# Patient Record
Sex: Male | Born: 1940 | Hispanic: No | Marital: Married | Smoking: Never smoker
Health system: Southern US, Community
[De-identification: ages and names within clinical notes are randomized; demographics above are authoritative.]

## PROBLEM LIST (undated history)

## (undated) ENCOUNTER — Ambulatory Visit (HOSPITAL_COMMUNITY): Admission: EM | Discharge status: Absent without leave | Payer: Medicare Other

## (undated) ENCOUNTER — Emergency Department (HOSPITAL_COMMUNITY): Discharge status: Absent without leave | Payer: Medicare Other

## (undated) DIAGNOSIS — J45909 Unspecified asthma, uncomplicated: Secondary | ICD-10-CM

## (undated) HISTORY — PX: ABDOMINAL SURGERY: SHX537

---

## 2001-03-20 ENCOUNTER — Emergency Department (HOSPITAL_COMMUNITY): Admission: EM | Admit: 2001-03-20 | Discharge: 2001-03-21 | Payer: Self-pay | Admitting: Emergency Medicine

## 2001-03-21 ENCOUNTER — Encounter: Payer: Self-pay | Admitting: Emergency Medicine

## 2001-03-22 ENCOUNTER — Emergency Department (HOSPITAL_COMMUNITY): Admission: EM | Admit: 2001-03-22 | Discharge: 2001-03-22 | Payer: Self-pay | Admitting: Emergency Medicine

## 2001-03-22 ENCOUNTER — Encounter: Payer: Self-pay | Admitting: Emergency Medicine

## 2001-03-25 ENCOUNTER — Encounter: Admission: RE | Admit: 2001-03-25 | Discharge: 2001-03-25 | Payer: Self-pay | Admitting: Internal Medicine

## 2001-04-26 ENCOUNTER — Encounter: Admission: RE | Admit: 2001-04-26 | Discharge: 2001-04-26 | Payer: Self-pay | Admitting: Internal Medicine

## 2001-05-03 ENCOUNTER — Ambulatory Visit (HOSPITAL_COMMUNITY): Admission: RE | Admit: 2001-05-03 | Discharge: 2001-05-03 | Payer: Self-pay

## 2001-06-02 ENCOUNTER — Encounter: Admission: RE | Admit: 2001-06-02 | Discharge: 2001-06-02 | Payer: Self-pay | Admitting: Internal Medicine

## 2001-11-15 ENCOUNTER — Emergency Department (HOSPITAL_COMMUNITY): Admission: EM | Admit: 2001-11-15 | Discharge: 2001-11-15 | Payer: Self-pay | Admitting: Emergency Medicine

## 2001-11-15 ENCOUNTER — Encounter: Payer: Self-pay | Admitting: Emergency Medicine

## 2012-09-07 DIAGNOSIS — R05 Cough: Secondary | ICD-10-CM | POA: Diagnosis not present

## 2013-05-11 DIAGNOSIS — M542 Cervicalgia: Secondary | ICD-10-CM | POA: Diagnosis not present

## 2013-05-11 DIAGNOSIS — F4310 Post-traumatic stress disorder, unspecified: Secondary | ICD-10-CM | POA: Diagnosis not present

## 2013-06-19 ENCOUNTER — Encounter (HOSPITAL_COMMUNITY): Payer: Self-pay | Admitting: Emergency Medicine

## 2013-06-19 ENCOUNTER — Emergency Department (INDEPENDENT_AMBULATORY_CARE_PROVIDER_SITE_OTHER)
Admission: EM | Admit: 2013-06-19 | Discharge: 2013-06-19 | Disposition: A | Discharge status: Home / Self Care | Payer: Medicare Other | Source: Home / Self Care | Attending: Family Medicine | Admitting: Family Medicine

## 2013-06-19 DIAGNOSIS — L299 Pruritus, unspecified: Secondary | ICD-10-CM | POA: Diagnosis not present

## 2013-06-19 DIAGNOSIS — L308 Other specified dermatitis: Secondary | ICD-10-CM

## 2013-06-19 MED ORDER — HYDROXYZINE HCL 25 MG PO TABS: 25.0000 mg | ORAL_TABLET | Freq: Four times a day (QID) | ORAL | Status: DC

## 2013-06-19 MED ORDER — METHYLPREDNISOLONE ACETATE 40 MG/ML IJ SUSP
80.0000 mg | Freq: Once | INTRAMUSCULAR | Status: AC
  Administered 2013-06-19: 80 mg via INTRAMUSCULAR

## 2013-06-19 MED ORDER — METHYLPREDNISOLONE ACETATE 80 MG/ML IJ SUSP
INTRAMUSCULAR | Status: AC
  Filled 2013-06-19: qty 1

## 2013-06-19 NOTE — ED Notes (Signed)
C/o skin irritation all over. Denies changes in soaps and or diet. Onset today.   No otc meds used for symptoms. Denies any other symptoms.

## 2013-06-19 NOTE — ED Provider Notes (Signed)
CSN: [id number]     Arrival date & time 06/19/13  1940 History   First MD Initiated Contact with Patient 06/19/13 2018     Chief Complaint  Patient presents with  . Rash   (Consider location/radiation/quality/duration/timing/severity/associated sxs/prior Treatment) Patient is a 72 y.o. male presenting with rash. The history is provided by the patient.  Rash Pain severity:  No pain Onset quality:  Gradual Duration:  12 hours Timing:  Constant Progression:  Unchanged Chronicity:  New Context: awakening from sleep   Associated symptoms comment:  Itching   History reviewed. No pertinent past medical history. History reviewed. No pertinent past surgical history. History reviewed. No pertinent family history. History  Substance Use Topics  . Smoking status: Never Smoker   . Smokeless tobacco: Not on file  . Alcohol Use: No    Review of Systems  Constitutional: Negative.   Gastrointestinal: Negative.   Musculoskeletal: Negative.   Skin: Positive for rash.    Allergies  Review of patient's allergies indicates no known allergies.  Home Medications   Current Outpatient Rx  Name  Route  Sig  Dispense  Refill  . hydrOXYzine (ATARAX/VISTARIL) 25 MG tablet   Oral   Take 1 tablet (25 mg total) by mouth every 6 (six) hours. For itching   20 tablet   0    There were no vitals taken for this visit. Physical Exam  Nursing note and vitals reviewed. Constitutional: He is oriented to person, place, and time. He appears well-developed and well-nourished.  Neck: Normal range of motion. Neck supple.  Neurological: He is alert and oriented to person, place, and time.  Skin: Skin is warm and dry. No rash noted.  gen dryness of skin.    ED Course  Procedures (including critical care time) Labs Review Labs Reviewed - No data to display Imaging Review No results found.    MDM      Linna Hoff, MD 06/19/13 2039

## 2013-06-21 DIAGNOSIS — F4310 Post-traumatic stress disorder, unspecified: Secondary | ICD-10-CM | POA: Diagnosis not present

## 2013-06-21 DIAGNOSIS — M542 Cervicalgia: Secondary | ICD-10-CM | POA: Diagnosis not present

## 2013-06-21 DIAGNOSIS — J301 Allergic rhinitis due to pollen: Secondary | ICD-10-CM | POA: Diagnosis not present

## 2013-07-25 DIAGNOSIS — F4310 Post-traumatic stress disorder, unspecified: Secondary | ICD-10-CM | POA: Diagnosis not present

## 2013-07-25 DIAGNOSIS — I10 Essential (primary) hypertension: Secondary | ICD-10-CM | POA: Diagnosis not present

## 2013-09-19 DIAGNOSIS — M542 Cervicalgia: Secondary | ICD-10-CM | POA: Diagnosis not present

## 2013-09-19 DIAGNOSIS — I10 Essential (primary) hypertension: Secondary | ICD-10-CM | POA: Diagnosis not present

## 2014-01-05 ENCOUNTER — Emergency Department (INDEPENDENT_AMBULATORY_CARE_PROVIDER_SITE_OTHER)
Admission: EM | Admit: 2014-01-05 | Discharge: 2014-01-05 | Disposition: A | Discharge status: Home / Self Care | Payer: Medicare Other | Source: Home / Self Care | Attending: Family Medicine | Admitting: Family Medicine

## 2014-01-05 ENCOUNTER — Emergency Department (INDEPENDENT_AMBULATORY_CARE_PROVIDER_SITE_OTHER): Payer: Medicare Other

## 2014-01-05 ENCOUNTER — Encounter (HOSPITAL_COMMUNITY): Payer: Self-pay | Admitting: Emergency Medicine

## 2014-01-05 DIAGNOSIS — M7500 Adhesive capsulitis of unspecified shoulder: Secondary | ICD-10-CM

## 2014-01-05 DIAGNOSIS — W19XXXA Unspecified fall, initial encounter: Secondary | ICD-10-CM | POA: Diagnosis not present

## 2014-01-05 DIAGNOSIS — S46909A Unspecified injury of unspecified muscle, fascia and tendon at shoulder and upper arm level, unspecified arm, initial encounter: Secondary | ICD-10-CM | POA: Diagnosis not present

## 2014-01-05 DIAGNOSIS — M25519 Pain in unspecified shoulder: Secondary | ICD-10-CM | POA: Diagnosis not present

## 2014-01-05 DIAGNOSIS — S4980XA Other specified injuries of shoulder and upper arm, unspecified arm, initial encounter: Secondary | ICD-10-CM | POA: Diagnosis not present

## 2014-01-05 MED ORDER — TRAMADOL HCL 50 MG PO TABS: 50.0000 mg | ORAL_TABLET | Freq: Four times a day (QID) | ORAL | Status: DC | PRN

## 2014-01-05 NOTE — ED Notes (Addendum)
Via friend, [employer] interpreter.  Pt c/o right shoulder/back pain onset x2 months Reports he fell and slipped onto concrete flooring; landed on right side Pain increases w/activity Denies head inj/LOC Alert w/no signs of acute distress.

## 2014-01-05 NOTE — ED Provider Notes (Signed)
Maxwell Jackson is a 73 y.o. male who presents to Urgent Care today for right shoulder pain. She fell 2 months ago landing on his shoulder. He had some mild pain at the initial injury however the pain is worsened over the past few weeks. He has pain with overhand motion and at bedtime. He denies any significant radiating pain weakness or numbness. He has tried some over-the-counter medications which have helped a bit.   History reviewed. No pertinent past medical history. History  Substance Use Topics  . Smoking status: Never Smoker   . Smokeless tobacco: Not on file  . Alcohol Use: No   ROS as above Medications: No current facility-administered medications for this encounter.   Current Outpatient Prescriptions  Medication Sig Dispense Refill  . hydrOXYzine (ATARAX/VISTARIL) 25 MG tablet Take 1 tablet (25 mg total) by mouth every 6 (six) hours. For itching  20 tablet  0    Exam:  BP 155/91  Pulse 85  Temp(Src) 97.6 F (36.4 C) (Oral)  Resp 14  SpO2 97% Gen: Well NAD Right shoulder:  Normal-appearing Mildly tender in the trapezius and bicipital groove.  Range of motion is limited in abduction are to about 100 both active and passive,  External rotation is limited to about 30 beyond neutral position Internal rotation is normal Positive Hawkins and Neer's impingement tests.   Contralateral left shoulder is normal appearing and nontender Full range of motion Negative impingement test.  Capillary refill sensation and pulses are intact distally.  No results found for this or any previous visit (from the past 24 hour(s)). Dg Shoulder Right  01/05/2014   CLINICAL DATA:  Larey SeatFell 2 months ago with continued right posterior shoulder pain  EXAM: RIGHT SHOULDER - 2+ VIEW  COMPARISON:  None.  FINDINGS: Small bone fragment off of the inferior glenoid without donor site to suggest it represents a fracture. This could represent a calcified and possibly detached labral fragment or osteophyte.  Humeral head is in normal position without evidence of humeral fracture. Minimal degenerative change of the acromioclavicular joint. There is also a tiny bone fragment seen on the axillary view projecting immediately anterior to the anterior cortex of the humeral head. There is mild cortical irregularity underlying this.  IMPRESSION: 1. Osteophyte versus calcified and possibly detached labral fragment inferior glenoid 2. Tiny bone fragment off of the anterior humeral head. This could represent subacute tiny fracture possibly related to avulsion at the time of fall. 3. Bony anatomy could be better evaluated in greater detail with CT of the shoulder. Alternately, shoulder MRI could be considered to evaluate as well.   Electronically Signed   By: Esperanza Heiraymond  Rubner M.D.   On: 01/05/2014 16:12    Assessment and Plan: 73 y.o. male with right shoulder pain. Patient suffered any injury during a fall about 2 months ago. He may have had a rotator cuff tear versus labrum injury at that time. It appears in the interval 2 months he is developing adhesive capsulitis.  Plan to use tramadol for pain control as I would like to avoid NSAIDs given his elevated blood pressure. [facility] for evaluation and management of this issue.   Discussed warning signs or symptoms. Please see discharge instructions. Patient expresses understanding.    Rodolph BongEvan S Quinita Kostelecky, MD 01/05/14 [phone]

## 2014-01-05 NOTE — Discharge Instructions (Signed)
Thank you for coming in today. Please see the orthopedic doctor.  Come back as needed.  Call [phone] if you want to change the appointment.   Adhesive Capsulitis Sometimes the shoulder becomes stiff and is painful to move. Some people say it feels as if the shoulder is frozen in place. Because of this, the condition is called "frozen shoulder." Its medical name is adhesive capsulitis.  The shoulder joint is made up of strong connective tissue that attaches the ball of the humerus to the shallow shoulder socket. This strong connective tissue is called the joint capsule. This tissue can become stiff and swollen. That is when adhesive capsulitis sets in. CAUSES  It is not always clear just what the cause adhesive capsulitis. Possibilities include:  Injury to the shoulder joint.  Strain. This is a repetitive injury brought about by overuse.  Lack of use. Perhaps your arm or hand was otherwise injured. It might have been in a sling for awhile. Or perhaps you were not using it to avoid pain.  Referred pain. This is a sort of trick the body plays. You feel pain in the shoulder. But, the pain actually comes from an injury somewhere else in the body.  Long-standing health problems. Several diseases can cause adhesive capsulitis. They include diabetes, heart disease, stroke, thyroid problems, rheumatoid arthritis and lung disease.  Being a women older than 40. Anyone can develop adhesive capsulitis but it is most common in women in this age group. SYMPTOMS   Pain.  It occurs when the arm is moved.  Parts of the shoulder might hurt if they are touched.  Pain is worse at night or when resting.  Soreness. It might not be strong enough to be called pain. But, the shoulder aches.  The shoulder does not move freely.  Muscle spasms.  Trouble sleeping because of shoulder ache or pain. DIAGNOSIS  To decide if you have adhesive capsulitis, your healthcare provider will probably:  Ask about  symptoms you have noticed.  Ask about your history of joint pain and anything that might have caused the pain.  Ask about your overall health.  Use hands to feel your shoulder and neck.  Ask you to move your shoulder in specific directions. This may indicate the origin of the pain.  Order imaging tests; pictures of the shoulder. They help pinpoint the source of the problem. An X-ray might be used. For more detail, an MRI is often used. An MRI details the tendons, muscles and ligaments as well as the joint. TREATMENT  Adhesive capsulitis can be treated several ways. Most treatments can be done in a clinic or in your healthcare provider's office. Be sure to discuss the different options with your caregiver. They include:  Physical therapy. You will work on specific exercises to get your shoulder moving again. The exercises usually involve stretching. A physical therapist (a caregiver with special training) can show you what to do and what not to do. The exercises will need to be done daily.  Medication.  Over-the-counter medicines may relieve pain and inflammation (the body's way of reacting to injury or infection).  Corticosteroids. These are stronger drugs to reduce pain and inflammation. They are given by injection (shots) into the shoulder joint. Frequent treatment is not recommended.  Muscle relaxants. Medication may be prescribed to ease muscle spasms.  Treatment of underlying conditions. This means treating another condition that is causing your shoulder problem. This might be a rotator cuff (tendon) problem  Shoulder manipulation. The  shoulder will be moved by your healthcare provider. You would be under general anesthesia (given a drug that puts you to sleep). You would not feel anything. Sometimes the joint will be injected with salt water (saline) at high pressure to break down internal scarring in the joint capsule.  Surgery. This is rarely needed. It may be suggested in  advanced cases after all other treatment has failed. PROGNOSIS  In time, most people recover from adhesive capsulitis. Sometimes, however, the pain goes away but full movement of the shoulder does not return.  HOME CARE INSTRUCTIONS   Take any pain medications recommended by your healthcare provider. Follow the directions carefully.  If you have physical therapy, follow through with the therapist's suggestions. Be sure you understand the exercises you will be doing. You should understand:  How often the exercises should be done.  How many times each exercise should be repeated.  How long they should be done.  What other activities you should do, or not do.  That you should warm up before doing any exercise. Just 5 to 10 minutes will help. Small, gentle movements should get your shoulder ready for more.  Avoid high-demand exercise that involves your shoulder such as throwing. This type of exercise can make pain worse.  Consider using cold packs. Cold may ease swelling and pain. Ask your healthcare provider if a cold pack might help you. If so, get directions on how and when to use them. SEEK MEDICAL CARE IF:   You have any questions about your medications.  Your pain continues to increase. Document Released: 06/21/2009 Document Revised: 11/16/2011 Document Reviewed: 06/21/2009 [facility] Patient Information 2014 [place].

## 2014-01-10 DIAGNOSIS — M25519 Pain in unspecified shoulder: Secondary | ICD-10-CM | POA: Diagnosis not present

## 2014-01-17 DIAGNOSIS — M19019 Primary osteoarthritis, unspecified shoulder: Secondary | ICD-10-CM | POA: Diagnosis not present

## 2014-01-25 DIAGNOSIS — M25519 Pain in unspecified shoulder: Secondary | ICD-10-CM | POA: Diagnosis not present

## 2014-01-25 DIAGNOSIS — I10 Essential (primary) hypertension: Secondary | ICD-10-CM | POA: Diagnosis not present

## 2014-01-25 DIAGNOSIS — M542 Cervicalgia: Secondary | ICD-10-CM | POA: Diagnosis not present

## 2014-02-16 DIAGNOSIS — M19019 Primary osteoarthritis, unspecified shoulder: Secondary | ICD-10-CM | POA: Diagnosis not present

## 2014-02-26 DIAGNOSIS — M6281 Muscle weakness (generalized): Secondary | ICD-10-CM | POA: Diagnosis not present

## 2014-02-26 DIAGNOSIS — M25619 Stiffness of unspecified shoulder, not elsewhere classified: Secondary | ICD-10-CM | POA: Diagnosis not present

## 2014-02-26 DIAGNOSIS — M25519 Pain in unspecified shoulder: Secondary | ICD-10-CM | POA: Diagnosis not present

## 2014-03-05 DIAGNOSIS — M25619 Stiffness of unspecified shoulder, not elsewhere classified: Secondary | ICD-10-CM | POA: Diagnosis not present

## 2014-03-05 DIAGNOSIS — M6281 Muscle weakness (generalized): Secondary | ICD-10-CM | POA: Diagnosis not present

## 2014-03-05 DIAGNOSIS — M25519 Pain in unspecified shoulder: Secondary | ICD-10-CM | POA: Diagnosis not present

## 2014-03-22 ENCOUNTER — Other Ambulatory Visit: Payer: Self-pay | Admitting: Cardiovascular Disease

## 2014-03-22 ENCOUNTER — Ambulatory Visit
Admission: RE | Admit: 2014-03-22 | Discharge: 2014-03-22 | Disposition: A | Discharge status: Home / Self Care | Payer: Medicare Other | Source: Ambulatory Visit | Attending: Cardiovascular Disease | Admitting: Cardiovascular Disease

## 2014-03-22 DIAGNOSIS — R51 Headache: Principal | ICD-10-CM

## 2014-03-22 DIAGNOSIS — R519 Headache, unspecified: Secondary | ICD-10-CM

## 2014-03-22 DIAGNOSIS — F4310 Post-traumatic stress disorder, unspecified: Secondary | ICD-10-CM | POA: Diagnosis not present

## 2014-03-22 DIAGNOSIS — R413 Other amnesia: Secondary | ICD-10-CM | POA: Diagnosis not present

## 2014-03-22 DIAGNOSIS — I10 Essential (primary) hypertension: Secondary | ICD-10-CM | POA: Diagnosis not present

## 2014-03-22 DIAGNOSIS — M25519 Pain in unspecified shoulder: Secondary | ICD-10-CM | POA: Diagnosis not present

## 2014-03-22 DIAGNOSIS — M542 Cervicalgia: Secondary | ICD-10-CM | POA: Diagnosis not present

## 2014-04-24 DIAGNOSIS — F4310 Post-traumatic stress disorder, unspecified: Secondary | ICD-10-CM | POA: Diagnosis not present

## 2014-04-24 DIAGNOSIS — I10 Essential (primary) hypertension: Secondary | ICD-10-CM | POA: Diagnosis not present

## 2014-04-24 DIAGNOSIS — J069 Acute upper respiratory infection, unspecified: Secondary | ICD-10-CM | POA: Diagnosis not present

## 2014-06-26 DIAGNOSIS — I10 Essential (primary) hypertension: Secondary | ICD-10-CM | POA: Diagnosis not present

## 2014-06-26 DIAGNOSIS — M542 Cervicalgia: Secondary | ICD-10-CM | POA: Diagnosis not present

## 2014-07-31 DIAGNOSIS — I10 Essential (primary) hypertension: Secondary | ICD-10-CM | POA: Diagnosis not present

## 2014-07-31 DIAGNOSIS — M542 Cervicalgia: Secondary | ICD-10-CM | POA: Diagnosis not present

## 2015-02-10 ENCOUNTER — Encounter (HOSPITAL_COMMUNITY): Payer: Self-pay | Admitting: *Deleted

## 2015-02-10 ENCOUNTER — Emergency Department (HOSPITAL_COMMUNITY): Payer: Medicare Other

## 2015-02-10 ENCOUNTER — Emergency Department (HOSPITAL_COMMUNITY)
Admission: EM | Admit: 2015-02-10 | Discharge: 2015-02-10 | Disposition: A | Discharge status: Home / Self Care | Payer: Medicare Other | Attending: Emergency Medicine | Admitting: Emergency Medicine

## 2015-02-10 DIAGNOSIS — R079 Chest pain, unspecified: Secondary | ICD-10-CM | POA: Diagnosis present

## 2015-02-10 DIAGNOSIS — Z9889 Other specified postprocedural states: Secondary | ICD-10-CM | POA: Insufficient documentation

## 2015-02-10 DIAGNOSIS — R112 Nausea with vomiting, unspecified: Secondary | ICD-10-CM | POA: Diagnosis not present

## 2015-02-10 DIAGNOSIS — I10 Essential (primary) hypertension: Secondary | ICD-10-CM | POA: Diagnosis not present

## 2015-02-10 DIAGNOSIS — R1084 Generalized abdominal pain: Secondary | ICD-10-CM | POA: Diagnosis not present

## 2015-02-10 DIAGNOSIS — R109 Unspecified abdominal pain: Secondary | ICD-10-CM | POA: Insufficient documentation

## 2015-02-10 DIAGNOSIS — R197 Diarrhea, unspecified: Secondary | ICD-10-CM | POA: Insufficient documentation

## 2015-02-10 DIAGNOSIS — M549 Dorsalgia, unspecified: Secondary | ICD-10-CM | POA: Diagnosis not present

## 2015-02-10 LAB — URINALYSIS, ROUTINE W REFLEX MICROSCOPIC
BILIRUBIN URINE: NEGATIVE
Glucose, UA: NEGATIVE mg/dL
HGB URINE DIPSTICK: NEGATIVE
Ketones, ur: NEGATIVE mg/dL
Leukocytes, UA: NEGATIVE
Nitrite: NEGATIVE
PH: 6.5 (ref 5.0–8.0)
Protein, ur: NEGATIVE mg/dL
SPECIFIC GRAVITY, URINE: 1.044 — AB (ref 1.005–1.030)
Urobilinogen, UA: 0.2 mg/dL (ref 0.0–1.0)

## 2015-02-10 LAB — COMPREHENSIVE METABOLIC PANEL
ALT: 17 U/L (ref 17–63)
ANION GAP: 9 (ref 5–15)
AST: 19 U/L (ref 15–41)
Albumin: 4 g/dL (ref 3.5–5.0)
Alkaline Phosphatase: 59 U/L (ref 38–126)
BUN: 6 mg/dL (ref 6–20)
CALCIUM: 8.9 mg/dL (ref 8.9–10.3)
CO2: 24 mmol/L (ref 22–32)
Chloride: 107 mmol/L (ref 101–111)
Creatinine, Ser: 0.77 mg/dL (ref 0.61–1.24)
GFR calc Af Amer: >60 mL/min (ref >60)
GFR calc non Af Amer: >60 mL/min (ref >60)
Glucose, Bld: 106 mg/dL — ABNORMAL HIGH (ref 65–99)
POTASSIUM: 3.2 mmol/L — AB (ref 3.5–5.1)
SODIUM: 140 mmol/L (ref 135–145)
TOTAL PROTEIN: 7 g/dL (ref 6.5–8.1)
Total Bilirubin: 1.4 mg/dL — ABNORMAL HIGH (ref 0.3–1.2)

## 2015-02-10 LAB — CBC WITH DIFFERENTIAL/PLATELET
Basophils Absolute: 0 10*3/uL (ref 0.0–0.1)
Basophils Relative: 0 % (ref 0–1)
Eosinophils Absolute: 0.3 10*3/uL (ref 0.0–0.7)
Eosinophils Relative: 5 % (ref 0–5)
HEMATOCRIT: 39.2 % (ref 39.0–52.0)
HEMOGLOBIN: 14 g/dL (ref 13.0–17.0)
Lymphocytes Relative: 29 % (ref 12–46)
Lymphs Abs: 1.5 10*3/uL (ref 0.7–4.0)
MCH: 22.7 pg — AB (ref 26.0–34.0)
MCHC: 35.7 g/dL (ref 30.0–36.0)
MCV: 63.4 fL — ABNORMAL LOW (ref 78.0–100.0)
MONOS PCT: 6 % (ref 3–12)
Monocytes Absolute: 0.3 10*3/uL (ref 0.1–1.0)
NEUTROS ABS: 3 10*3/uL (ref 1.7–7.7)
Neutrophils Relative %: 60 % (ref 43–77)
PLATELETS: 124 10*3/uL — AB (ref 150–400)
RBC: 6.18 MIL/uL — ABNORMAL HIGH (ref 4.22–5.81)
RDW: 14.9 % (ref 11.5–15.5)
WBC: 5.1 10*3/uL (ref 4.0–10.5)

## 2015-02-10 LAB — I-STAT TROPONIN, ED: Troponin i, poc: 0 ng/mL (ref 0.00–0.08)

## 2015-02-10 LAB — I-STAT CG4 LACTIC ACID, ED: LACTIC ACID, VENOUS: 1.82 mmol/L (ref 0.5–2.0)

## 2015-02-10 LAB — LIPASE, BLOOD: LIPASE: 18 U/L — AB (ref 22–51)

## 2015-02-10 MED ORDER — OMEPRAZOLE 20 MG PO CPDR: DELAYED_RELEASE_CAPSULE | ORAL | Status: DC

## 2015-02-10 MED ORDER — IOHEXOL 300 MG/ML  SOLN
80.0000 mL | Freq: Once | INTRAMUSCULAR | Status: AC | PRN
  Administered 2015-02-10: 80 mL via INTRAVENOUS

## 2015-02-10 MED ORDER — HYDROMORPHONE HCL 1 MG/ML IJ SOLN
0.5000 mg | Freq: Once | INTRAMUSCULAR | Status: AC
  Administered 2015-02-10: 0.5 mg via INTRAVENOUS
  Filled 2015-02-10: qty 1

## 2015-02-10 MED ORDER — ONDANSETRON HCL 4 MG/2ML IJ SOLN
4.0000 mg | Freq: Once | INTRAMUSCULAR | Status: AC
  Administered 2015-02-10: 4 mg via INTRAVENOUS
  Filled 2015-02-10: qty 2

## 2015-02-10 MED ORDER — SUCRALFATE 1 G PO TABS: 1.0000 g | ORAL_TABLET | Freq: Three times a day (TID) | ORAL | Status: DC

## 2015-02-10 NOTE — ED Notes (Signed)
Pt reports "burning" that starts in his stomach and goes up into his chest, generalized in area.  The pain also goes around to entire back.  Pain increased with palpation to abdomen.

## 2015-02-10 NOTE — Discharge Instructions (Signed)
Please read and follow all provided instructions.  Your diagnoses today include:  1. Abdominal pain    Tests performed today include:  Blood counts and electrolytes  Blood tests to check liver and kidney function  Blood tests to check pancreas function  Urine test to look for infection   CT scan of abdomen and pelvis - normal  Vital signs. See below for your results today.   Medications prescribed:   Omeprazole (Prilosec) - stomach acid reducer  This medication can be found over-the-counter   Carafate - for stomach upset and to protect your stomach  Take any prescribed medications only as directed.  Home care instructions:   Follow any educational materials contained in this packet.  Follow-up instructions: Please follow-up with your primary care provider in the next 3 days for routine check-up. See the stomach doctor in 1 week for evaluation of your stomach pain.   Return instructions:  SEEK IMMEDIATE MEDICAL ATTENTION IF:  The pain does not go away or becomes severe   A temperature above 101F develops   Repeated vomiting occurs (multiple episodes)   The pain becomes localized to portions of the abdomen. The right side could possibly be appendicitis. In an adult, the left lower portion of the abdomen could be colitis or diverticulitis.   Blood is being passed in stools or vomit (bright red or black tarry stools)   You develop chest pain, difficulty breathing, dizziness or fainting, or become confused, poorly responsive, or inconsolable (young children)  If you have any other emergent concerns regarding your health  Additional Information: Abdominal (belly) pain can be caused by many things. Your caregiver performed an examination and possibly ordered blood/urine tests and imaging (CT scan, x-rays, ultrasound). Many cases can be observed and treated at home after initial evaluation in the emergency department. Even though you are being discharged home, abdominal  pain can be unpredictable. Therefore, you need a repeated exam if your pain does not resolve, returns, or worsens. Most patients with abdominal pain don't have to be admitted to the hospital or have surgery, but serious problems like appendicitis and gallbladder attacks can start out as nonspecific pain. Many abdominal conditions cannot be diagnosed in one visit, so follow-up evaluations are very important.  Your vital signs today were: BP 177/80 mmHg   Pulse 71   Temp(Src) 97.8 F (36.6 C) (Oral)   Resp 21   SpO2 100% If your blood pressure (bp) was elevated above 135/85 this visit, please have this repeated by your doctor within one month. --------------

## 2015-02-10 NOTE — ED Notes (Signed)
pts vital signs updated and iv removed. Pt getting dressed and awaiting discharge paperwork at bedside.

## 2015-02-10 NOTE — ED Notes (Signed)
Pt reports epigastric burning pain for 3-4 weeks. Pt also reports a cough at night that burns his throat.
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
## 2015-03-27 DIAGNOSIS — M542 Cervicalgia: Secondary | ICD-10-CM | POA: Diagnosis not present

## 2015-03-27 DIAGNOSIS — J208 Acute bronchitis due to other specified organisms: Secondary | ICD-10-CM | POA: Diagnosis not present

## 2015-03-27 DIAGNOSIS — I10 Essential (primary) hypertension: Secondary | ICD-10-CM | POA: Diagnosis not present

## 2015-04-11 ENCOUNTER — Emergency Department (INDEPENDENT_AMBULATORY_CARE_PROVIDER_SITE_OTHER)
Admission: EM | Admit: 2015-04-11 | Discharge: 2015-04-11 | Disposition: A | Discharge status: Home / Self Care | Payer: Medicare Other | Source: Home / Self Care | Attending: Emergency Medicine | Admitting: Emergency Medicine

## 2015-04-11 ENCOUNTER — Encounter (HOSPITAL_COMMUNITY): Payer: Self-pay | Admitting: Emergency Medicine

## 2015-04-11 DIAGNOSIS — K279 Peptic ulcer, site unspecified, unspecified as acute or chronic, without hemorrhage or perforation: Secondary | ICD-10-CM | POA: Diagnosis not present

## 2015-04-11 HISTORY — DX: Unspecified asthma, uncomplicated: J45.909

## 2015-04-11 LAB — POCT URINALYSIS DIP (DEVICE)
Bilirubin Urine: NEGATIVE
Glucose, UA: NEGATIVE mg/dL
Hgb urine dipstick: NEGATIVE
Ketones, ur: NEGATIVE mg/dL
LEUKOCYTES UA: NEGATIVE
NITRITE: NEGATIVE
PH: 6.5 (ref 5.0–8.0)
PROTEIN: NEGATIVE mg/dL
Specific Gravity, Urine: >=1.03 (ref 1.005–1.030)
Urobilinogen, UA: 0.2 mg/dL (ref 0.0–1.0)

## 2015-04-11 MED ORDER — GI COCKTAIL ~~LOC~~
30.0000 mL | Freq: Once | ORAL | Status: AC
  Administered 2015-04-11: 30 mL via ORAL

## 2015-04-11 MED ORDER — RANITIDINE HCL 150 MG PO CAPS
150.0000 mg | ORAL_CAPSULE | Freq: Two times a day (BID) | ORAL | Status: DC
Start: 2015-04-11 — End: 2023-01-22

## 2015-04-11 MED ORDER — GI COCKTAIL ~~LOC~~
ORAL | Status: AC
  Filled 2015-04-11: qty 30

## 2015-04-11 MED ORDER — PANTOPRAZOLE SODIUM 40 MG PO TBEC: 40.0000 mg | DELAYED_RELEASE_TABLET | Freq: Every day | ORAL | Status: DC

## 2015-04-11 NOTE — ED Notes (Signed)
Patient complains of abdominal pain and center chest pain.  Described as burning.  Coughs, but phlegm is thick and stays in throat.  Last bm was today and described as hard.

## 2015-04-11 NOTE — ED Provider Notes (Addendum)
CSN: [id number]     Arrival date & time 04/11/15  1830 History   First MD Initiated Contact with Patient 04/11/15 2008     Chief Complaint  Patient presents with  . Abdominal Pain   (Consider location/radiation/quality/duration/timing/severity/associated sxs/prior Treatment) HPI He is a 74 year old man here with a family member who acts as interpreter for abdominal pain. He reports a several month history of burning epigastric pain. It radiates into his chest. It is associated with nonbloody nonbilious vomiting. He also reports a sensation of food getting stuck in his throat.  He denies any diarrhea or constipation.  No blood in the stool. He was seen for this pain in June and started on omeprazole and Carafate. He states these medicines did not help. It is also associated with a cough. He states it feels like phlegm comes up, but he is not able to cough it out. No fevers or chills. No shortness of breath.  Past Medical History  Diagnosis Date  . Asthma    Past Surgical History  Procedure Laterality Date  . Abdominal surgery     No family history on file. History  Substance Use Topics  . Smoking status: Never Smoker   . Smokeless tobacco: Not on file  . Alcohol Use: No    Review of Systems As in history of present illness Allergies  Review of patient's allergies indicates no known allergies.  Home Medications   Prior to Admission medications   Medication Sig Start Date End Date Taking? Authorizing Provider  pantoprazole (PROTONIX) 40 MG tablet Take 1 tablet (40 mg total) by mouth daily. 04/11/15   Charm Rings, MD  Phenyleph-Doxylamine-DM-APAP 5-6.25-10-325 MG/15ML LIQD Take 15 mLs by mouth at bedtime as needed (cold and cough symptoms).    Historical Provider, MD  ranitidine (ZANTAC) 150 MG capsule Take 1 capsule (150 mg total) by mouth 2 (two) times daily. 04/11/15   Charm Rings, MD   BP 145/85 mmHg  Pulse 69  Temp(Src) 97 F (36.1 C) (Oral)  Resp 18  SpO2 98% Physical  Exam  Constitutional: He is oriented to person, place, and time. He appears well-developed and well-nourished. No distress.  Thin man.  Appears younger than stated age.  Cardiovascular: Normal rate, regular rhythm and normal heart sounds.   No murmur heard. Pulmonary/Chest: Effort normal and breath sounds normal. No respiratory distress. He has no wheezes. He has no rales. He exhibits tenderness (sternal borders as well as lower rib cage).  Abdominal: Soft. Bowel sounds are normal. He exhibits no distension and no mass. There is tenderness (An epigastric primarily, but tender throughout). There is no rebound and no guarding.  Neurological: He is alert and oriented to person, place, and time.    ED Course  Procedures (including critical care time) ED ECG REPORT   Date: 04/11/2015  Rate: 64  Rhythm: normal sinus rhythm  QRS Axis: normal  Intervals: normal  ST/T Wave abnormalities: normal  Conduction Disutrbances:none  Narrative Interpretation: NSR, normal ekg  Old EKG Reviewed: unchanged  I have personally reviewed the EKG tracing and disagree with the computerized printout as noted.  Labs Review Labs Reviewed  POCT URINALYSIS DIP (DEVICE)    Imaging Review No results found.   MDM   1. PUD (peptic ulcer disease)    GI cocktail given.  He reports some improvement after the GI cocktail. He does state he feels like the medicine got stuck in his throat. We'll treat with protonix and ranitidine. Strongly encouraged  him to follow-up with GI for additional evaluation. 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     Charm Rings, MD 04/11/15 2113  Charm Rings, MD 04/11/15 2114

## 2015-04-11 NOTE — Discharge Instructions (Signed)
I think you have irritation of your stomach lining and your esophagus. 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 Take protonix daily. Take ranitidine twice a day. You need to take these medicines for several days in order to see a change. Please call the GI doctor as soon as possible for an appointment.

## 2015-12-02 ENCOUNTER — Encounter (HOSPITAL_COMMUNITY): Payer: Self-pay | Admitting: Emergency Medicine

## 2015-12-02 ENCOUNTER — Emergency Department (HOSPITAL_COMMUNITY)
Admission: EM | Admit: 2015-12-02 | Discharge: 2015-12-02 | Disposition: A | Discharge status: Home / Self Care | Payer: Medicare Other | Attending: Emergency Medicine | Admitting: Emergency Medicine

## 2015-12-02 DIAGNOSIS — J45909 Unspecified asthma, uncomplicated: Secondary | ICD-10-CM | POA: Insufficient documentation

## 2015-12-02 DIAGNOSIS — R21 Rash and other nonspecific skin eruption: Secondary | ICD-10-CM | POA: Diagnosis present

## 2015-12-02 DIAGNOSIS — B029 Zoster without complications: Secondary | ICD-10-CM | POA: Insufficient documentation

## 2015-12-02 DIAGNOSIS — Z79899 Other long term (current) drug therapy: Secondary | ICD-10-CM | POA: Insufficient documentation

## 2015-12-02 MED ORDER — PANTOPRAZOLE SODIUM 40 MG PO TBEC: 40.0000 mg | DELAYED_RELEASE_TABLET | Freq: Every day | ORAL | Status: DC

## 2015-12-02 MED ORDER — OXYCODONE-ACETAMINOPHEN 5-325 MG PO TABS
2.0000 | ORAL_TABLET | Freq: Once | ORAL | Status: AC
  Administered 2015-12-02: 2 via ORAL
  Filled 2015-12-02: qty 2

## 2015-12-02 MED ORDER — OXYCODONE-ACETAMINOPHEN 5-325 MG PO TABS: 2.0000 | ORAL_TABLET | ORAL | Status: DC | PRN

## 2015-12-02 MED ORDER — PREDNISONE 10 MG PO TABS: ORAL_TABLET | ORAL | Status: DC

## 2015-12-02 MED ORDER — ACYCLOVIR 800 MG PO TABS: ORAL_TABLET | ORAL | Status: DC

## 2015-12-02 NOTE — ED Notes (Signed)
Pt to ER via private vehicle with complaint of left neck and left arm rash. Onset 1 week ago. No fever noted. Reports tightness in left neck. A/O x4.

## 2015-12-02 NOTE — ED Provider Notes (Signed)
CSN: [id number]     Arrival date & time 12/02/15  [id number] History   First MD Initiated Contact with Patient 12/02/15 [phone]     Chief Complaint  Patient presents with  . Rash     (Consider location/radiation/quality/duration/timing/severity/associated sxs/prior Treatment) Patient is a 75 y.o. male presenting with rash. The history is provided by the patient. No language interpreter was used.  Rash Location:  Shoulder/arm Shoulder/arm rash location:  L arm Quality: blistering   Severity:  Moderate Onset quality:  Gradual Duration:  5 days Timing:  Constant Progression:  Worsening Chronicity:  New Context: not animal contact and not insect bite/sting   Relieved by:  Nothing Worsened by:  Nothing tried Associated symptoms: no abdominal pain     Past Medical History  Diagnosis Date  . Asthma    Past Surgical History  Procedure Laterality Date  . Abdominal surgery     History reviewed. No pertinent family history. Social History  Substance Use Topics  . Smoking status: Never Smoker   . Smokeless tobacco: None  . Alcohol Use: No    Review of Systems  Gastrointestinal: Negative for abdominal pain.  Skin: Positive for rash.  All other systems reviewed and are negative.     Allergies  Review of patient's allergies indicates no known allergies.  Home Medications   Prior to Admission medications   Medication Sig Start Date End Date Taking? Authorizing Provider  pantoprazole (PROTONIX) 40 MG tablet Take 1 tablet (40 mg total) by mouth daily. 04/11/15   Charm RingsErin J Honig, MD  Phenyleph-Doxylamine-DM-APAP 5-6.25-10-325 MG/15ML LIQD Take 15 mLs by mouth at bedtime as needed (cold and cough symptoms).    Historical Provider, MD  ranitidine (ZANTAC) 150 MG capsule Take 1 capsule (150 mg total) by mouth 2 (two) times daily. 04/11/15   Charm RingsErin J Honig, MD   BP 152/96 mmHg  Pulse 72  Temp(Src) 97.7 F (36.5 C) (Oral)  Resp 16  SpO2 100% Physical Exam  Constitutional: He is oriented to  person, place, and time. He appears well-developed and well-nourished.  HENT:  Head: Normocephalic and atraumatic.  Right Ear: External ear normal.  Left Ear: External ear normal.  Nose: Nose normal.  Mouth/Throat: Oropharynx is clear and moist.  Eyes: Conjunctivae and EOM are normal.  Neck: Normal range of motion.  Cardiovascular: Normal rate and normal heart sounds.   Pulmonary/Chest: Effort normal.  Abdominal: He exhibits no distension.  Musculoskeletal: Normal range of motion.  Neurological: He is alert and oriented to person, place, and time.  Skin: Rash noted.  Blisters left arm and back of neck,  Follows dermatone  Psychiatric: He has a normal mood and affect.  Nursing note and vitals reviewed.   ED Course  Procedures (including critical care time) Labs Review Labs Reviewed - No data to display  Imaging Review No results found. I have personally reviewed and evaluated these images and lab results as part of my medical decision-making.   EKG Interpretation None      MDM   Final diagnoses:  Herpes zoster    Meds ordered this encounter  Medications  . oxyCODONE-acetaminophen (PERCOCET/ROXICET) 5-325 MG tablet    Sig: Take 2 tablets by mouth every 4 (four) hours as needed for severe pain.    Dispense:  20 tablet    Refill:  0    Order Specific Question:  Supervising Provider    Answer:  MILLER, BRIAN [[id number]]  . acyclovir (ZOVIRAX) 800 MG tablet    Sig: One  po qid    Dispense:  40 tablet    Refill:  0    Order Specific Question:  Supervising Provider    Answer:  Eber Hong [[id number]]   An After Visit Summary was printed and given to the patient.  Lonia Skinner Kingsland, PA-C 12/02/15 1005  Arby Barrette, MD 12/04/15 1030

## 2015-12-02 NOTE — Discharge Instructions (Signed)

## 2016-04-08 ENCOUNTER — Ambulatory Visit (HOSPITAL_COMMUNITY)
Admission: RE | Admit: 2016-04-08 | Discharge: 2016-04-08 | Disposition: A | Discharge status: Home / Self Care | Payer: Medicare Other | Source: Ambulatory Visit | Attending: Family Medicine | Admitting: Family Medicine

## 2016-04-08 ENCOUNTER — Ambulatory Visit (INDEPENDENT_AMBULATORY_CARE_PROVIDER_SITE_OTHER): Payer: Worker's Compensation | Admitting: Family Medicine

## 2016-04-08 ENCOUNTER — Encounter (HOSPITAL_COMMUNITY): Payer: Self-pay

## 2016-04-08 ENCOUNTER — Ambulatory Visit: Payer: Worker's Compensation

## 2016-04-08 VITALS — BP 110/62 | HR 78 | Temp 97.9°F | Resp 14 | Ht 62.0 in | Wt 113.0 lb

## 2016-04-08 DIAGNOSIS — S3992XA Unspecified injury of lower back, initial encounter: Secondary | ICD-10-CM

## 2016-04-08 DIAGNOSIS — S0990XA Unspecified injury of head, initial encounter: Secondary | ICD-10-CM

## 2016-04-08 DIAGNOSIS — M5136 Other intervertebral disc degeneration, lumbar region: Secondary | ICD-10-CM | POA: Diagnosis not present

## 2016-04-08 NOTE — Patient Instructions (Addendum)
You may not return to work until further evaluation of head injury with CT scan and until seen by orthopedics for evaluation of back injury.    IF you received an x-ray today, you will receive an invoice from [facility]. Please contact [facility] at [phone] with questions or concerns regarding your invoice.   IF you received labwork today, you will receive an invoice from [employer]. Please contact [facility] at [phone] with questions or concerns regarding your invoice.   Our billing staff will not be able to assist you with questions regarding bills from these companies.  You will be contacted with the lab results as soon as they are available. The fastest way to get your results is to activate your My Chart account. Instructions are located on the last page of this paperwork. If you have not heard from [place] regarding the results in 2 weeks, please contact this office.
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
## 2016-04-10 DIAGNOSIS — M5136 Other intervertebral disc degeneration, lumbar region: Secondary | ICD-10-CM | POA: Insufficient documentation

## 2016-04-10 MED ORDER — GABAPENTIN 300 MG PO CAPS: 300.0000 mg | ORAL_CAPSULE | Freq: Three times a day (TID) | ORAL | 1 refills | Status: DC

## 2016-04-20 ENCOUNTER — Ambulatory Visit (INDEPENDENT_AMBULATORY_CARE_PROVIDER_SITE_OTHER): Payer: Worker's Compensation | Admitting: Physician Assistant

## 2016-04-20 ENCOUNTER — Ambulatory Visit: Payer: Worker's Compensation

## 2016-04-20 VITALS — BP 118/74 | HR 68 | Temp 97.6°F | Resp 18 | Ht 62.0 in | Wt 109.0 lb

## 2016-04-20 DIAGNOSIS — R059 Cough, unspecified: Secondary | ICD-10-CM

## 2016-04-20 DIAGNOSIS — R0789 Other chest pain: Secondary | ICD-10-CM

## 2016-04-20 DIAGNOSIS — R05 Cough: Secondary | ICD-10-CM | POA: Diagnosis not present

## 2016-04-20 MED ORDER — OXYCODONE-ACETAMINOPHEN 5-325 MG PO TABS: 2.0000 | ORAL_TABLET | ORAL | 0 refills | Status: DC | PRN

## 2016-04-20 MED ORDER — BENZONATATE 100 MG PO CAPS: 100.0000 mg | ORAL_CAPSULE | Freq: Three times a day (TID) | ORAL | 0 refills | Status: DC | PRN

## 2016-04-20 NOTE — Patient Instructions (Addendum)
     IF you received an x-ray today, you will receive an invoice from [facility]. Please contact [facility] at [phone] with questions or concerns regarding your invoice.   IF you received labwork today, you will receive an invoice from [facility]. Please contact [facility] at [phone] with questions or concerns regarding your invoice.   Our billing staff will not be able to assist you with questions regarding bills from these companies.  You will be contacted with the lab results as soon as they are available. The fastest way to get your results is to activate your My Chart account. Instructions are located on the last page of this paperwork. If you have not heard from us regarding the results in 2 weeks, please contact this office.      Rib Fracture A rib fracture is a break or crack in one of the bones of the ribs. The ribs are like a cage that goes around your upper chest. A broken or cracked rib is often painful, but most do not cause other problems. Most rib fractures heal on their own in 1-3 months. HOME CARE  Avoid activities that cause pain to the injured area. Protect your injured area.  Slowly increase activity as told by your doctor.  Take medicine as told by your doctor.  Put ice on the injured area for the first 1-2 days after you have been treated or as told by your doctor.  Put ice in a plastic bag.  Place a towel between your skin and the bag.  Leave the ice on for 15-20 minutes at a time, every 2 hours while you are awake.  Do deep breathing as told by your doctor. You may be told to:  Take deep breaths many times a day.  Cough many times a day while hugging a pillow.  Use a device (incentive spirometer) to perform deep breathing many times a day.  Drink enough fluids to keep your pee (urine) clear or pale yellow.   Do not wear a rib belt or binder. These do not allow you to breathe deeply. GET HELP  RIGHT AWAY IF:   You have a fever.  You have trouble breathing.   You cannot stop coughing.  You cough up thick or bloody spit (mucus).   You feel sick to your stomach (nauseous), throw up (vomit), or have belly (abdominal) pain.   Your pain gets worse and medicine does not help.  MAKE SURE YOU:   Understand these instructions.  Will watch your condition.  Will get help right away if you are not doing well or get worse.   This information is not intended to replace advice given to you by your health care provider. Make sure you discuss any questions you have with your health care provider.   Document Released: 06/02/2008 Document Revised: 12/19/2012 Document Reviewed: 10/26/2012 Elsevier Interactive Patient Education [employer]2016 Elsevier Inc.

## 2016-04-20 NOTE — Progress Notes (Signed)
Maxwell Jackson  MRN: [id number] DOB: 12/11/1940  PCP: No PCP Per Patient  Subjective:  Pt presents to clinic for follow-up fall 12 days ago. He is here today with a friend, Dery, who translates for him. His primary lang is [place] a dialect of [place].   He works in Aeronautical engineerlandscaping, was moving items from a truck, tripped and fell onto the street. Lost consciousness for 2-3 minutes. Head CT showed no hemorrhage or intracranial injury. Low back xray showed no acute fractures.   He has been out of work since the incident.   Today he complains of lingering right-sided chest pain that is worse when he coughs and sneezes.  The pain has not improved since the incident.  No chest xray was performed at his initial visit. He requests a chest xray today; he wishes to show results to his employer.   Also complains of non-productive cough, worse at night. Hasn't tried anything. Makes his ribs hurt worse.  +right sided chest pain, pain with inspiration/cough/sneezing.   Review of Systems  Constitutional: Negative.   Respiratory: Positive for shortness of breath (due to pain with inspiration). Negative for apnea, cough, choking, chest tightness and wheezing.   Cardiovascular: Positive for chest pain (lower right, with cough/sneezing). Negative for palpitations.  Gastrointestinal: Negative.   Neurological: Negative for dizziness, syncope, weakness, light-headedness and headaches.  Hematological: Does not bruise/bleed easily.    Patient Active Problem List   Diagnosis Date Noted  . Degenerative disc disease, lumbar 04/10/2016    Current Outpatient Prescriptions on File Prior to Visit  Medication Sig Dispense Refill  . pantoprazole (PROTONIX) 40 MG tablet Take 1 tablet (40 mg total) by mouth daily. 30 tablet 1  . ranitidine (ZANTAC) 150 MG capsule Take 1 capsule (150 mg total) by mouth 2 (two) times daily. 60 capsule 1  . acyclovir (ZOVIRAX) 800 MG tablet One po qid (Patient not taking: Reported on  04/08/2016) 40 tablet 0  . gabapentin (NEURONTIN) 300 MG capsule Take 1 capsule (300 mg total) by mouth 3 (three) times daily. (Patient not taking: Reported on 04/20/2016) 90 capsule 1  . oxyCODONE-acetaminophen (PERCOCET/ROXICET) 5-325 MG tablet Take 2 tablets by mouth every 4 (four) hours as needed for severe pain. (Patient not taking: Reported on 04/08/2016) 20 tablet 0  . [DISCONTINUED] omeprazole (PRILOSEC) 20 MG capsule Take one capsule PO twice a day for 3 days, then one capsule PO once a day 30 capsule 0  . [DISCONTINUED] sucralfate (CARAFATE) 1 G tablet Take 1 tablet (1 g total) by mouth 4 (four) times daily -  with meals and at bedtime. 60 tablet 0   No current facility-administered medications on file prior to visit.     No Known Allergies  Objective:  BP 118/74 (BP Location: Right Arm, Patient Position: Sitting, Cuff Size: Small)   Pulse 68   Temp 97.6 F (36.4 C) (Oral)   Resp 18   Ht 5\' 2"  (1.575 m)   Wt 109 lb (49.4 kg)   SpO2 97%   BMI 19.94 kg/m   Physical Exam  Constitutional: He is oriented to person, place, and time and well-developed, well-nourished, and in no distress. No distress.  HENT:  Head: Normocephalic and atraumatic.  Eyes: Conjunctivae are normal. Pupils are equal, round, and reactive to light.  Cardiovascular: Normal rate, regular rhythm and normal heart sounds.   Pulmonary/Chest: Effort normal and breath sounds normal. No accessory muscle usage. No apnea and no tachypnea. No respiratory distress. He has no  decreased breath sounds. He exhibits tenderness and bony tenderness. He exhibits no deformity.    Breath sounds present all lobes.    Abdominal: Soft. He exhibits no distension and no mass. There is no tenderness.  Neurological: He is alert and oriented to person, place, and time. GCS score is 15.  Skin: Skin is warm. He is not diaphoretic.  Psychiatric: Mood, memory, affect and judgment normal.  Vitals reviewed.  Dg Ribs Unilateral W/chest  Right  Result Date: 04/20/2016 CLINICAL DATA:  Chest pain; limited history available from the patient. EXAM: RIGHT RIBS AND CHEST - 3+ VIEW COMPARISON:  Chest x-ray of February 10, 2015 FINDINGS: The lungs are well-expanded and clear. The heart and mediastinal structures are normal. There is no pleural effusion or pneumothorax. There is stable radiodensity that projects over the lower thorax on the left. Right rib detail images include a metallic marker over the lower lateral ribs. Deep to this no rib abnormality is observed. Elsewhere the right ribs are unremarkable. There is mild multilevel degenerative disc disease of the thoracic spine. IMPRESSION: 1. There is no active cardiopulmonary disease. 2. No acute right rib abnormality is observed. Electronically Signed   By: David  [place] M.D.   On: 04/20/2016 13:55   Assessment and Plan :  1. Other chest pain - DG Ribs Unilateral W/Chest Right; Future - No evidence of fracture from the xray, however this patient was uncomfortable and tender for most of the physical exam of the ribs. Rib fracture suspected.   - Discussed with interpreter rib fractures take 4-6 weeks to heal, instructions on rib fracture home management printed out for patient.  Patient appeared to understand.  - oxyCODONE-acetaminophen (PERCOCET/ROXICET) 5-325 MG tablet; Take 2 tablets by mouth every 4 (four) hours as needed for severe pain.  Dispense: 20 tablet; Refill: 0  2. Cough - benzonatate (TESSALON) 100 MG capsule; Take 1-2 capsules (100-200 mg total) by mouth 3 (three) times daily as needed for cough.  Dispense: 40 capsule; Refill: 0   Marco CollieWhitney Ulyess Muto, PA-C  [facility] 04/20/2016 12:04 PM

## 2016-05-06 ENCOUNTER — Ambulatory Visit (INDEPENDENT_AMBULATORY_CARE_PROVIDER_SITE_OTHER): Payer: Worker's Compensation | Admitting: Physician Assistant

## 2016-05-06 VITALS — BP 110/60 | HR 69 | Temp 98.0°F | Resp 18 | Ht 62.0 in | Wt 110.0 lb

## 2016-05-06 DIAGNOSIS — S3992XD Unspecified injury of lower back, subsequent encounter: Secondary | ICD-10-CM

## 2016-05-06 DIAGNOSIS — R05 Cough: Secondary | ICD-10-CM | POA: Diagnosis not present

## 2016-05-06 DIAGNOSIS — R059 Cough, unspecified: Secondary | ICD-10-CM

## 2016-05-06 DIAGNOSIS — R0789 Other chest pain: Secondary | ICD-10-CM | POA: Diagnosis not present

## 2016-05-06 MED ORDER — BENZONATATE 100 MG PO CAPS: 100.0000 mg | ORAL_CAPSULE | Freq: Three times a day (TID) | ORAL | 0 refills | Status: DC | PRN

## 2016-05-06 MED ORDER — MELOXICAM 7.5 MG PO TABS: 7.5000 mg | ORAL_TABLET | Freq: Every day | ORAL | 0 refills | Status: DC

## 2016-05-06 NOTE — Patient Instructions (Addendum)
I am giving you mobic.  It is an anti-inflammatory.  Do not take with ibuprofen or naproxen.  You can take tylenol.   Please take this for cough.  It is not suggested that you use the medication for cough.  It can drive your heart up.   Please return within the next week if your symptoms return.     IF you received an x-ray today, you will receive an invoice from [facility]. Please contact [facility] at [phone] with questions or concerns regarding your invoice.   IF you received labwork today, you will receive an invoice from [facility]. Please contact [facility] at [phone] with questions or concerns regarding your invoice.   Our billing staff will not be able to assist you with questions regarding bills from these companies.  You will be contacted with the lab results as soon as they are available. The fastest way to get your results is to activate your My Chart account. Instructions are located on the last page of this paperwork. If you have not heard from us regarding the results in 2 weeks, please contact this office.

## 2016-05-06 NOTE — Progress Notes (Signed)
Patient ID: Maxwell Jackson, male   DOB: 09/20/1940, 75 y.o.   MRN: [id number] [facility] [address], [place] [id number] [phone]  Date:  05/06/2016   Name:  Maxwell Jackson   DOB:  04/17/1941   MRN:  [id number]  PCP:  No PCP Per Patient   By signing my name below, I, Essence Howell, attest that this documentation has been prepared under the direction and in the presence of Trena PlattStephanie English, PA-C Electronically Signed: Charline BillsEssence Howell, ED Scribe 05/06/2016 at 12:45 PM.  History of Present Illness:  Maxwell AcostaHyai Chriswell is a 75 y.o. male patient who [facility] for a note to return to work. Pt's friend is present to translate for him at this visit. Pt had a fall on 04/08/16 while at work. He slid backwards off a truck, struck his head and landed directly on his back. Pt had a normal head CT and was started on Gabapentin 300 mg t.i.d. Pt states that back pain has improved significantly since that visit. Pt was also seen for right-sided chest pain and cough which he states has improved but is still intermittent. He was started on [employer] Perles but requests a refill of this medication at this time. He denies numbness/tingling in lower extremities. Pt also requests a medical releases form at this visit.   Patient Active Problem List   Diagnosis Date Noted   Degenerative disc disease, lumbar 04/10/2016    Social History  Substance Use Topics   Smoking status: Never Smoker   Smokeless tobacco: Never Used   Alcohol use No   No Known Allergies  Medication list has been reviewed and updated.  Current Outpatient Prescriptions on File Prior to Visit  Medication Sig Dispense Refill   oxyCODONE-acetaminophen (PERCOCET/ROXICET) 5-325 MG tablet Take 2 tablets by mouth every 4 (four) hours as needed for severe pain. 20 tablet 0   pantoprazole (PROTONIX) 40 MG tablet Take 1 tablet (40 mg total) by mouth daily. 30 tablet 1   ranitidine (ZANTAC) 150 MG capsule Take 1 capsule (150 mg  total) by mouth 2 (two) times daily. 60 capsule 1   [DISCONTINUED] omeprazole (PRILOSEC) 20 MG capsule Take one capsule PO twice a day for 3 days, then one capsule PO once a day 30 capsule 0   [DISCONTINUED] sucralfate (CARAFATE) 1 G tablet Take 1 tablet (1 g total) by mouth 4 (four) times daily -  with meals and at bedtime. 60 tablet 0   No current facility-administered medications on file prior to visit.     Review of Systems  Respiratory: Positive for cough.   Cardiovascular: Negative for chest pain.  Musculoskeletal: Negative for back pain.  Neurological: Negative for tingling.    Physical Examination: BP 110/60    Pulse 69    Temp 98 F (36.7 C) (Oral)    Resp 18    Ht 5\' 2"  (1.575 m)    Wt 110 lb (49.9 kg)    SpO2 98%    BMI 20.12 kg/m  Ideal Body Weight: @FLOWAMB ([id number])@([phone])@  Physical Exam  Constitutional: He is oriented to person, place, and time. He appears well-developed and well-nourished. No distress.  HENT:  Head: Normocephalic and atraumatic.  Eyes: Conjunctivae and EOM are normal. Pupils are equal, round, and reactive to light.  Cardiovascular: Normal rate.   Pulmonary/Chest: Effort normal. No respiratory distress.  Musculoskeletal:  No spinous tenderness throughout. Normal hip ROM. Normal reflexes at LE.   Neurological: He is alert and oriented  to person, place, and time.  Skin: Skin is warm and dry. He is not diaphoretic.  Psychiatric: He has a normal mood and affect. His behavior is normal.    Assessment and Plan: Maxwell Jackson is a 75 y.o. male who is here today for a return to work note after a fall that injured back. --he states that he is ready to return to full work without restrictions.  He has asked for additional controlled narcotic.  At this time, I do not recommend this.  I have advised firmly that if he needs this medicine, then perhaps he is not ready.  He states that this is not the case, and it was just in case.  Given low anti-inflammatory, and  tessalon pearls for supportive treatment of possible cough.   --work restrictions released, but advised to return within the week if this is not working   Cough - Plan: benzonatate (TESSALON) 100 MG capsule, meloxicam (MOBIC) 7.5 MG tablet  Other chest pain  Back injury, initial encounter  Trena Platt, PA-C [facility] 05/06/2016 12:16 PM

## 2016-07-25 ENCOUNTER — Emergency Department (HOSPITAL_COMMUNITY)
Admission: EM | Admit: 2016-07-25 | Discharge: 2016-07-25 | Discharge status: Absent without leave | Payer: Medicare Other

## 2016-07-25 ENCOUNTER — Other Ambulatory Visit (HOSPITAL_COMMUNITY): Payer: Self-pay | Admitting: Student

## 2016-07-25 ENCOUNTER — Emergency Department (HOSPITAL_COMMUNITY)
Admission: EM | Admit: 2016-07-25 | Discharge: 2016-07-26 | Disposition: A | Discharge status: Home / Self Care | Payer: Medicare Other | Attending: Emergency Medicine | Admitting: Emergency Medicine

## 2016-07-25 DIAGNOSIS — J45909 Unspecified asthma, uncomplicated: Secondary | ICD-10-CM | POA: Diagnosis not present

## 2016-07-25 DIAGNOSIS — Y92410 Unspecified street and highway as the place of occurrence of the external cause: Secondary | ICD-10-CM | POA: Insufficient documentation

## 2016-07-25 DIAGNOSIS — Y999 Unspecified external cause status: Secondary | ICD-10-CM | POA: Diagnosis not present

## 2016-07-25 DIAGNOSIS — M542 Cervicalgia: Secondary | ICD-10-CM | POA: Insufficient documentation

## 2016-07-25 DIAGNOSIS — Y939 Activity, unspecified: Secondary | ICD-10-CM | POA: Insufficient documentation

## 2016-07-25 DIAGNOSIS — R51 Headache: Secondary | ICD-10-CM | POA: Insufficient documentation

## 2016-07-25 DIAGNOSIS — Z791 Long term (current) use of non-steroidal anti-inflammatories (NSAID): Secondary | ICD-10-CM | POA: Insufficient documentation

## 2016-07-25 DIAGNOSIS — S199XXA Unspecified injury of neck, initial encounter: Secondary | ICD-10-CM | POA: Diagnosis not present

## 2016-07-25 DIAGNOSIS — S0990XA Unspecified injury of head, initial encounter: Secondary | ICD-10-CM | POA: Diagnosis not present

## 2016-07-25 DIAGNOSIS — R52 Pain, unspecified: Secondary | ICD-10-CM

## 2016-07-25 DIAGNOSIS — W19XXXA Unspecified fall, initial encounter: Secondary | ICD-10-CM

## 2016-07-25 NOTE — Discharge Instructions (Signed)
Read the information below.  Your imaging studies did not show any acute abnormalities.  You may feel sore for the next 2-3 days. You can take tylenol 650mg  every 6hrs for pain relief.  You can apply heat/ice to affected areas for 20 minute increments.  Warm showers can soothe sore muscles.  If symptoms persist for more than a week follow up with your primary provider. I have provided the contact information above.  You may return to the Emergency Department at any time for worsening condition or any new symptoms that concern you.

## 2016-07-26 ENCOUNTER — Emergency Department (HOSPITAL_COMMUNITY): Payer: Medicare Other

## 2016-07-26 DIAGNOSIS — R51 Headache: Secondary | ICD-10-CM | POA: Diagnosis not present

## 2016-07-26 NOTE — ED Notes (Signed)
Bed: WHALG Expected date:  Expected time:  Means of arrival:  Comments: 
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
## 2016-07-26 NOTE — Discharge Instructions (Signed)
Read the information below.  Your x-rays were re-assuring. You have a metallic foreign body in the tissues of your neck.  You may feel sore for the next 2-3 days.  Take tylenol for pain relief.  You can apply heat/ice to affected areas for 20 minute increments.  Warm showers can soothe sore muscles.  If symptoms persist for more than a week follow up with your primary provider.  Use the prescribed medication as directed.  Please discuss all new medications with your pharmacist.   You may return to the Emergency Department at any time for worsening condition or any new symptoms that concern you.

## 2018-07-02 DIAGNOSIS — Z043 Encounter for examination and observation following other accident: Secondary | ICD-10-CM | POA: Diagnosis not present

## 2018-07-02 DIAGNOSIS — R918 Other nonspecific abnormal finding of lung field: Secondary | ICD-10-CM | POA: Diagnosis not present

## 2018-07-02 DIAGNOSIS — S2221XA Fracture of manubrium, initial encounter for closed fracture: Secondary | ICD-10-CM | POA: Diagnosis not present

## 2018-07-02 DIAGNOSIS — R1084 Generalized abdominal pain: Secondary | ICD-10-CM | POA: Diagnosis not present

## 2018-07-02 DIAGNOSIS — R001 Bradycardia, unspecified: Secondary | ICD-10-CM | POA: Diagnosis not present

## 2018-07-02 DIAGNOSIS — S20219A Contusion of unspecified front wall of thorax, initial encounter: Secondary | ICD-10-CM | POA: Diagnosis not present

## 2018-07-02 DIAGNOSIS — R51 Headache: Secondary | ICD-10-CM | POA: Diagnosis not present

## 2018-07-02 DIAGNOSIS — S2020XA Contusion of thorax, unspecified, initial encounter: Secondary | ICD-10-CM | POA: Diagnosis not present

## 2018-07-02 DIAGNOSIS — S0990XA Unspecified injury of head, initial encounter: Secondary | ICD-10-CM | POA: Diagnosis not present

## 2018-07-02 DIAGNOSIS — R109 Unspecified abdominal pain: Secondary | ICD-10-CM | POA: Diagnosis not present

## 2018-07-03 DIAGNOSIS — Z043 Encounter for examination and observation following other accident: Secondary | ICD-10-CM | POA: Diagnosis not present

## 2018-07-03 DIAGNOSIS — R51 Headache: Secondary | ICD-10-CM | POA: Diagnosis not present

## 2019-12-02 ENCOUNTER — Ambulatory Visit: Payer: Medicare Other | Attending: Internal Medicine

## 2019-12-02 DIAGNOSIS — Z23 Encounter for immunization: Secondary | ICD-10-CM

## 2019-12-02 NOTE — Progress Notes (Signed)
   Covid-19 Vaccination Clinic  Name:  Maxwell Jackson    MRN: [id number] DOB: [birth year]  12/02/2019  [name] was observed post Covid-19 immunization for 15 minutes without incident. He was provided with Vaccine Information Sheet and instruction to access the V-Safe system.   [name] was instructed to call 911 with any severe reactions post vaccine: Marland Kitchen Difficulty breathing  . Swelling of face and throat  . A fast heartbeat  . A bad rash all over body  . Dizziness and weakness

## 2021-11-09 ENCOUNTER — Emergency Department (HOSPITAL_COMMUNITY): Payer: Medicare Other

## 2021-11-09 ENCOUNTER — Emergency Department (HOSPITAL_COMMUNITY)
Admission: EM | Admit: 2021-11-09 | Discharge: 2021-11-09 | Disposition: A | Discharge status: Home / Self Care | Payer: Medicare Other | Attending: Emergency Medicine | Admitting: Emergency Medicine

## 2021-11-09 DIAGNOSIS — R1013 Epigastric pain: Secondary | ICD-10-CM | POA: Diagnosis present

## 2021-11-09 DIAGNOSIS — K2970 Gastritis, unspecified, without bleeding: Secondary | ICD-10-CM | POA: Diagnosis not present

## 2021-11-09 LAB — CBC
HCT: 43.4 % (ref 39.0–52.0)
Hemoglobin: 14.4 g/dL (ref 13.0–17.0)
MCH: 21.7 pg — ABNORMAL LOW (ref 26.0–34.0)
MCHC: 33.2 g/dL (ref 30.0–36.0)
MCV: 65.3 fL — ABNORMAL LOW (ref 80.0–100.0)
Platelets: 146 10*3/uL — ABNORMAL LOW (ref 150–400)
RBC: 6.65 MIL/uL — ABNORMAL HIGH (ref 4.22–5.81)
RDW: 15.4 % (ref 11.5–15.5)
WBC: 11.1 10*3/uL — ABNORMAL HIGH (ref 4.0–10.5)
nRBC: 0 % (ref 0.0–0.2)

## 2021-11-09 LAB — BASIC METABOLIC PANEL
Anion gap: 11 (ref 5–15)
BUN: 28 mg/dL — ABNORMAL HIGH (ref 8–23)
CO2: 23 mmol/L (ref 22–32)
Calcium: 9.2 mg/dL (ref 8.9–10.3)
Chloride: 103 mmol/L (ref 98–111)
Creatinine, Ser: 1.3 mg/dL — ABNORMAL HIGH (ref 0.61–1.24)
GFR, Estimated: 56 mL/min — ABNORMAL LOW (ref >60)
Glucose, Bld: 123 mg/dL — ABNORMAL HIGH (ref 70–99)
Potassium: 4.2 mmol/L (ref 3.5–5.1)
Sodium: 137 mmol/L (ref 135–145)

## 2021-11-09 LAB — HEPATIC FUNCTION PANEL
ALT: 20 U/L (ref 0–44)
AST: 33 U/L (ref 15–41)
Albumin: 3.8 g/dL (ref 3.5–5.0)
Alkaline Phosphatase: 71 U/L (ref 38–126)
Bilirubin, Direct: 0.3 mg/dL — ABNORMAL HIGH (ref 0.0–0.2)
Indirect Bilirubin: 0.7 mg/dL (ref 0.3–0.9)
Total Bilirubin: 1 mg/dL (ref 0.3–1.2)
Total Protein: 7 g/dL (ref 6.5–8.1)

## 2021-11-09 LAB — LIPASE, BLOOD: Lipase: 32 U/L (ref 11–51)

## 2021-11-09 LAB — TROPONIN I (HIGH SENSITIVITY): Troponin I (High Sensitivity): 6 ng/L (ref <18)

## 2021-11-09 MED ORDER — SUCRALFATE 1 G PO TABS: 1.0000 g | ORAL_TABLET | Freq: Three times a day (TID) | ORAL | 0 refills | Status: DC

## 2021-11-09 MED ORDER — FENTANYL CITRATE PF 50 MCG/ML IJ SOSY
50.0000 ug | PREFILLED_SYRINGE | Freq: Once | INTRAMUSCULAR | Status: AC
  Administered 2021-11-09: 50 ug via INTRAVENOUS
  Filled 2021-11-09: qty 1

## 2021-11-09 MED ORDER — ONDANSETRON HCL 4 MG/2ML IJ SOLN
4.0000 mg | Freq: Once | INTRAMUSCULAR | Status: AC
  Administered 2021-11-09: 4 mg via INTRAVENOUS
  Filled 2021-11-09: qty 2

## 2021-11-09 MED ORDER — SODIUM CHLORIDE 0.9 % IV BOLUS
1000.0000 mL | Freq: Once | INTRAVENOUS | Status: AC
Start: 2021-11-09 — End: 2021-11-09
  Administered 2021-11-09: 1000 mL via INTRAVENOUS

## 2021-11-09 MED ORDER — IOHEXOL 300 MG/ML  SOLN
75.0000 mL | Freq: Once | INTRAMUSCULAR | Status: AC | PRN
  Administered 2021-11-09: 75 mL via INTRAVENOUS

## 2021-11-09 MED ORDER — ESOMEPRAZOLE MAGNESIUM 40 MG PO CPDR: 40.0000 mg | DELAYED_RELEASE_CAPSULE | Freq: Every day | ORAL | 0 refills | Status: DC

## 2021-11-09 MED ORDER — FAMOTIDINE IN NACL 20-0.9 MG/50ML-% IV SOLN
20.0000 mg | Freq: Once | INTRAVENOUS | Status: AC
  Administered 2021-11-09: 20 mg via INTRAVENOUS
  Filled 2021-11-09: qty 50

## 2021-11-09 MED ORDER — ONDANSETRON HCL 4 MG PO TABS
4.0000 mg | ORAL_TABLET | Freq: Four times a day (QID) | ORAL | 0 refills | Status: DC
Start: 2021-11-09 — End: 2023-01-22

## 2021-11-09 NOTE — ED Notes (Signed)
Pt provided w/ a urinal and informed we need a sample.  ?

## 2021-11-09 NOTE — Discharge Instructions (Addendum)
You have some gastritis. ? ?Take Zofran for nausea ? ?Take Nexium 40 mg daily ? ?Take Carafate 4 times daily ? ?You need to follow-up with GI doctor for endoscopy ? ?Return to ER if you have worse abdominal pain, vomiting, chest pain ? ?

## 2021-11-09 NOTE — ED Notes (Signed)
Patient transported to X-ray 
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
## 2021-11-09 NOTE — ED Notes (Signed)
Patient transported to CT 

## 2022-05-13 ENCOUNTER — Other Ambulatory Visit: Payer: Self-pay

## 2022-05-13 ENCOUNTER — Encounter (HOSPITAL_COMMUNITY): Payer: Self-pay | Admitting: Emergency Medicine

## 2022-05-13 ENCOUNTER — Ambulatory Visit (HOSPITAL_COMMUNITY)
Admission: EM | Admit: 2022-05-13 | Discharge: 2022-05-13 | Disposition: A | Discharge status: Home / Self Care | Payer: Medicare Other | Attending: Physician Assistant | Admitting: Physician Assistant

## 2022-05-13 DIAGNOSIS — R42 Dizziness and giddiness: Secondary | ICD-10-CM

## 2022-05-13 DIAGNOSIS — R001 Bradycardia, unspecified: Secondary | ICD-10-CM | POA: Diagnosis not present

## 2022-05-13 DIAGNOSIS — I10 Essential (primary) hypertension: Secondary | ICD-10-CM

## 2022-05-13 DIAGNOSIS — G43C0 Periodic headache syndromes in child or adult, not intractable: Secondary | ICD-10-CM

## 2022-05-13 MED ORDER — HYDROCHLOROTHIAZIDE 12.5 MG PO TABS: 12.5000 mg | ORAL_TABLET | Freq: Every day | ORAL | 0 refills | Status: DC

## 2022-05-13 MED ORDER — KETOROLAC TROMETHAMINE 30 MG/ML IJ SOLN
INTRAMUSCULAR | Status: AC
  Filled 2022-05-13: qty 1

## 2022-05-13 MED ORDER — KETOROLAC TROMETHAMINE 30 MG/ML IJ SOLN
30.0000 mg | Freq: Once | INTRAMUSCULAR | Status: AC
  Administered 2022-05-13: 30 mg via INTRAMUSCULAR

## 2022-05-13 MED ORDER — MECLIZINE HCL 12.5 MG PO TABS: 12.5000 mg | ORAL_TABLET | Freq: Three times a day (TID) | ORAL | 0 refills | Status: DC | PRN

## 2022-05-13 NOTE — ED Triage Notes (Signed)
Onset one week ago of dizziness and headache.  Non-specific head pain, pain is all over.  Vision blurry, light sensitivity.  Head and chest stuffiness.  Reports a cough, non-productive  Near syncopal episode 4-5 days ago.   Slight chest pain.

## 2022-05-13 NOTE — ED Provider Notes (Signed)
[facility]    CSN: [id number] Arrival date & time: 05/13/22  0931      History   Chief Complaint Chief Complaint  Patient presents with   Dizziness    HPI Maxwell Jackson is a 81 y.o. male.   81 year old male presents with headache, dizziness, sinus congestion.  History is being taken through the daughter being the interpreter and the language is Rade.  The patient indicates for the past week he has been having headache, intermittent but persistent, feels like somebody is squeezing his head.  He relates having photophobia, intermittent blurred vision.  Intermittent dizziness and off-balance sensation, and mild nausea without vomiting.  He indicates he has taken Tylenol without relief of his symptoms.  He does have a history of having migraine headaches intermittently but has been several months since of the last episode of headache.  His daughter indicates that he almost had a syncopal episode 4 to 5 days ago due to his symptoms.  He also indicates that at the same time he has been having sinus congestion with frontal and maxillary sinus pressure, congestion, and intermittent cough.  He has had some chest tenderness and tightness with discomfort when he coughs and when he breathes deep.  He denies any shortness of breath.  He denies any fever or chills. Patient does not have a PCP.  Today he does have systolic elevation of blood pressure along with bradycardia with a pulse rate of 54.  Daughter indicates that he has had elevated blood pressure in the past but she was unaware of him having a slow pulse rate.    Dizziness   Past Medical History:  Diagnosis Date   Asthma     Patient Active Problem List   Diagnosis Date Noted   Degenerative disc disease, lumbar 04/10/2016    Past Surgical History:  Procedure Laterality Date   ABDOMINAL SURGERY         Home Medications    Prior to Admission medications   Medication Sig Start Date End Date Taking? Authorizing Provider   hydrochlorothiazide (HYDRODIURIL) 12.5 MG tablet Take 1 tablet (12.5 mg total) by mouth daily. To reduce blood pressure. 05/13/22  Yes Ellsworth Lennox, PA-C  meclizine (ANTIVERT) 12.5 MG tablet Take 1 tablet (12.5 mg total) by mouth 3 (three) times daily as needed for dizziness. 05/13/22  Yes Ellsworth Lennox, PA-C  benzonatate (TESSALON) 100 MG capsule Take 1-2 capsules (100-200 mg total) by mouth 3 (three) times daily as needed for cough. Patient not taking: Reported on 05/13/2022 05/06/16   Trena Platt D, PA  esomeprazole (NEXIUM) 40 MG capsule Take 1 capsule (40 mg total) by mouth daily. Patient not taking: Reported on 05/13/2022 11/09/21   Charlynne Pander, MD  meloxicam (MOBIC) 7.5 MG tablet Take 1 tablet (7.5 mg total) by mouth daily. Patient not taking: Reported on 05/13/2022 05/06/16   Trena Platt D, PA  ondansetron (ZOFRAN) 4 MG tablet Take 1 tablet (4 mg total) by mouth every 6 (six) hours. Patient not taking: Reported on 05/13/2022 11/09/21   Charlynne Pander, MD  oxyCODONE-acetaminophen (PERCOCET/ROXICET) 5-325 MG tablet Take 2 tablets by mouth every 4 (four) hours as needed for severe pain. Patient not taking: Reported on 05/13/2022 04/20/16   McVey, Madelaine Bhat, PA-C  pantoprazole (PROTONIX) 40 MG tablet Take 1 tablet (40 mg total) by mouth daily. Patient not taking: Reported on 05/13/2022 12/02/15   Elson Areas, PA-C  ranitidine (ZANTAC) 150 MG capsule Take 1 capsule (150 mg  total) by mouth 2 (two) times daily. Patient not taking: Reported on 05/13/2022 04/11/15   Charm Rings, MD  sucralfate (CARAFATE) 1 g tablet Take 1 tablet (1 g total) by mouth 4 (four) times daily -  with meals and at bedtime. Patient not taking: Reported on 05/13/2022 11/09/21   Charlynne Pander, MD    Family History History reviewed. No pertinent family history.  Social History Social History   Tobacco Use   Smoking status: Never   Smokeless tobacco: Never  Vaping Use   Vaping Use: Never used   Substance Use Topics   Alcohol use: No   Drug use: No     Allergies   Patient has no known allergies.   Review of Systems Review of Systems  Constitutional:  Positive for fatigue.  HENT:  Positive for sinus pressure.   Neurological:  Positive for dizziness.     Physical Exam Triage Vital Signs ED Triage Vitals  Enc Vitals Group     BP 05/13/22 1059 (!) 210/94     Pulse Rate 05/13/22 1059 64     Resp 05/13/22 1059 18     Temp 05/13/22 1059 97.9 F (36.6 C)     Temp Source 05/13/22 1059 Oral     SpO2 05/13/22 1059 97 %     Weight --      Height --      Head Circumference --      Peak Flow --      Pain Score 05/13/22 1056 10     Pain Loc --      Pain Edu? --      Excl. in GC? --    No data found.  Updated Vital Signs BP (!) 210/94 (BP Location: Right Arm)   Pulse 64   Temp 97.9 F (36.6 C) (Oral)   Resp 18   SpO2 97%   Visual Acuity Right Eye Distance:   Left Eye Distance:   Bilateral Distance:    Right Eye Near:   Left Eye Near:    Bilateral Near:     Physical Exam Constitutional:      Appearance: Normal appearance.  HENT:     Right Ear: Ear canal normal. Tympanic membrane is injected.     Left Ear: Ear canal normal. Tympanic membrane is injected.     Mouth/Throat:     Mouth: Mucous membranes are moist.     Pharynx: Oropharynx is clear. Uvula midline. No posterior oropharyngeal erythema.  Cardiovascular:     Rate and Rhythm: Normal rate and regular rhythm.     Heart sounds: Normal heart sounds.  Pulmonary:     Effort: Pulmonary effort is normal.     Breath sounds: Normal breath sounds and air entry. No wheezing, rhonchi or rales.  Lymphadenopathy:     Cervical: No cervical adenopathy.  Neurological:     Mental Status: He is alert and oriented to person, place, and time.     Cranial Nerves: Cranial nerves 2-12 are intact.     Coordination: Coordination is intact.     Gait: Gait is intact.      UC Treatments / Results  Labs (all labs  ordered are listed, but only abnormal results are displayed) Labs Reviewed - No data to display  EKG: Today and there is presence of sinus bradycardia with the rate being 54 beats a minute, this compares to a normal rate of 79 on last EKG 11/09/2021.  There are no acute changes as compared  to EKG on 11/09/2021.   Radiology No results found.  Procedures Procedures (including critical care time)  Medications Ordered in UC Medications  ketorolac (TORADOL) 30 MG/ML injection 30 mg (30 mg Intramuscular Given 05/13/22 1146)    Initial Impression / Assessment and Plan / UC Course  I have reviewed the triage vital signs and the nursing notes.  Pertinent labs & imaging results that were available during my care of the patient were reviewed by me and considered in my medical decision making (see chart for details).    Plan: 1.  Internal referral has been made to cardiology for evaluation of the systolic hypertension and bradycardia. 2.  Start HCTZ 12.5 mg every morning to help reduce the systolic hypertension. 3.  Antivert 12.5 mg every 8 hours to help control the dizziness over the next several days. 4.  Advised to increase fluid intake to guard against dehydration over the next several days. 5.  Advised if symptoms fail to improve over the next 48 to 72 hours or symptoms worsen then report to the emergency room for evaluation. Final Clinical Impressions(s) / UC Diagnoses   Final diagnoses:  Dizziness  Bradycardia  Periodic headache syndrome, not intractable  Systolic hypertension     Discharge Instructions      Advised to take Antivert every 8 hours to help control the dizziness. Advised to take the HCTZ only in the morning to help control the blood pressure. Internal referral has been made to cardiology, they should be contacting you within the next 48 hours to arrange an appointment for evaluation of the systolic hypertension and bradycardia. If symptoms fail to improve or increase  over the 48 to 72 hours then advised to either return to urgent care or report to emergency room.    ED Prescriptions     Medication Sig Dispense Auth. Provider   meclizine (ANTIVERT) 12.5 MG tablet Take 1 tablet (12.5 mg total) by mouth 3 (three) times daily as needed for dizziness. 15 tablet Ellsworth Lennox, PA-C   hydrochlorothiazide (HYDRODIURIL) 12.5 MG tablet Take 1 tablet (12.5 mg total) by mouth daily. To reduce blood pressure. 30 tablet Ellsworth Lennox, PA-C      PDMP not reviewed this encounter.   Ellsworth Lennox, PA-C 05/13/22 1153

## 2022-05-13 NOTE — Discharge Instructions (Signed)
Advised to take Antivert every 8 hours to help control the dizziness. Advised to take the HCTZ only in the morning to help control the blood pressure. Internal referral has been made to cardiology, they should be contacting you within the next 48 hours to arrange an appointment for evaluation of the systolic hypertension and bradycardia. If symptoms fail to improve or increase over the 48 to 72 hours then advised to either return to urgent care or report to emergency room.

## 2022-05-13 NOTE — ED Notes (Signed)
Church member came with patient to translate

## 2022-08-07 IMAGING — CT CT ABD-PELV W/ CM
2 of 5 series · 13 of 46 positions shown, 15 images · IV contrast (APPLIED)
Comparison: CT 02/10/2015

CLINICAL DATA: Nausea and vomiting.  Acute abdominal pain.

EXAM:
CT ABDOMEN AND PELVIS WITH CONTRAST
TECHNIQUE: Multidetector CT imaging of the abdomen and pelvis was performed
using the standard protocol following bolus administration of
intravenous contrast.

[Series 3: abdomen 5.0 · axial · 0.60mm/px · z∈[+941,+1306]mm · 10 of 85 slices shown, 12 images]
[im 6/85  soft-tissue]
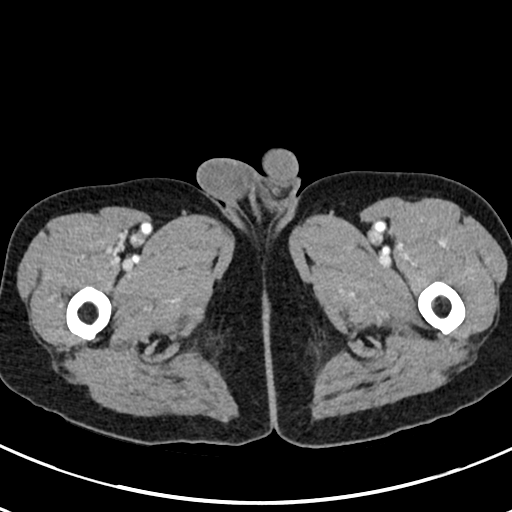
[im 6/85  bone]
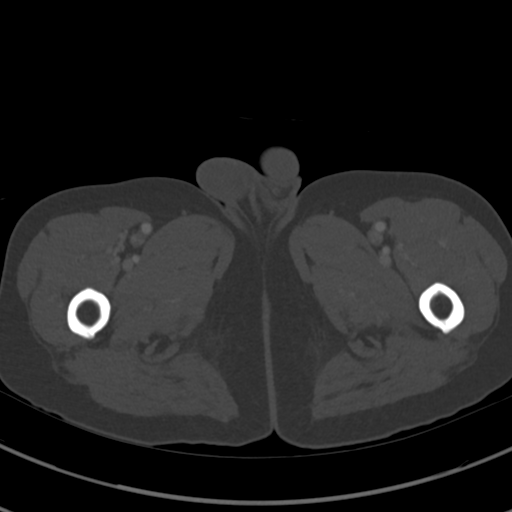
[im 16/85  soft-tissue]
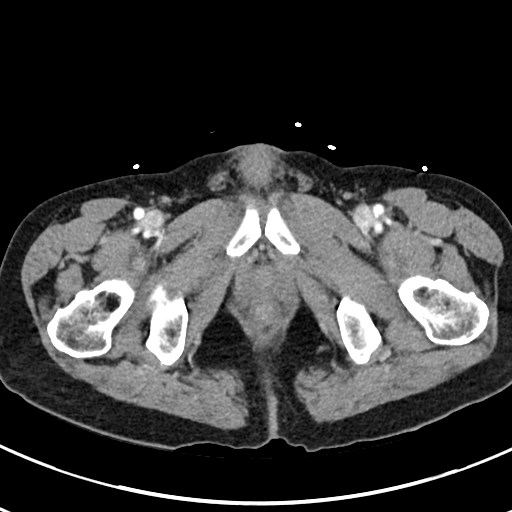
[im 22/85  soft-tissue]
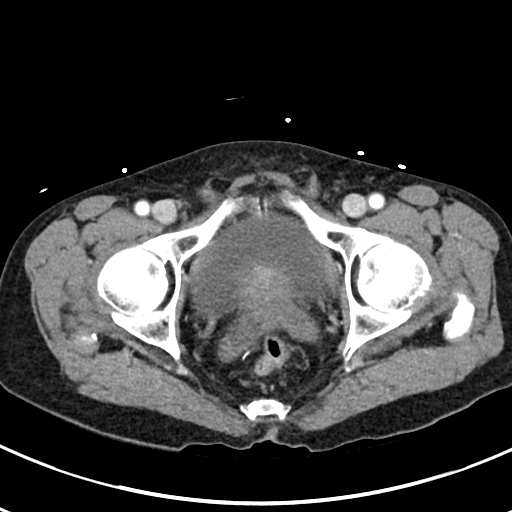
[im 32/85  soft-tissue]
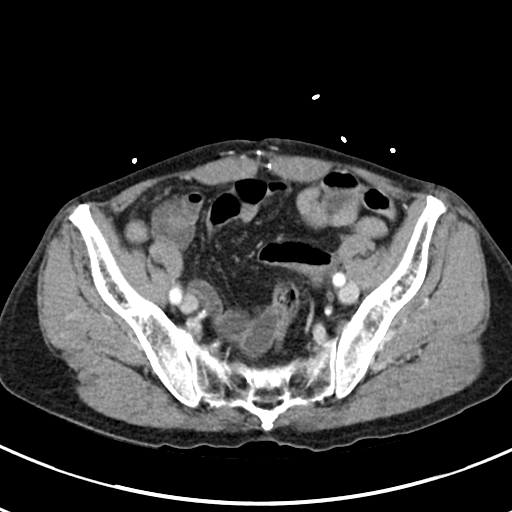
[im 37/85  soft-tissue]
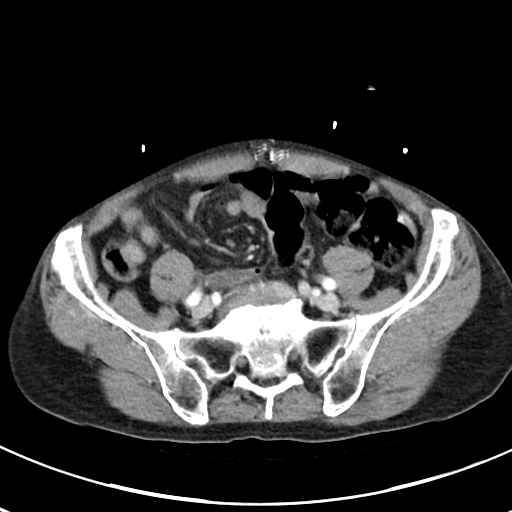
[im 48/85  soft-tissue]
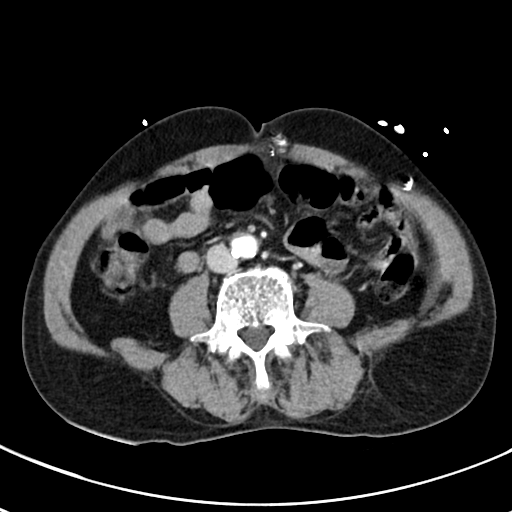
[im 53/85  soft-tissue]
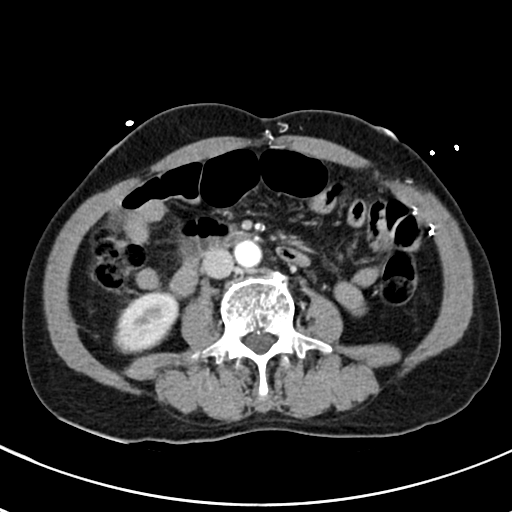
[im 64/85  soft-tissue]
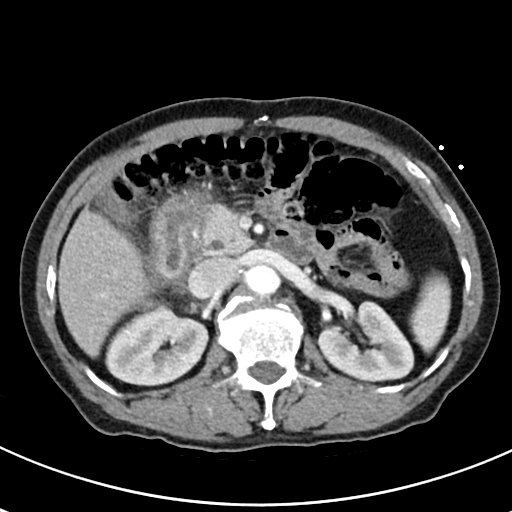
[im 69/85  soft-tissue]
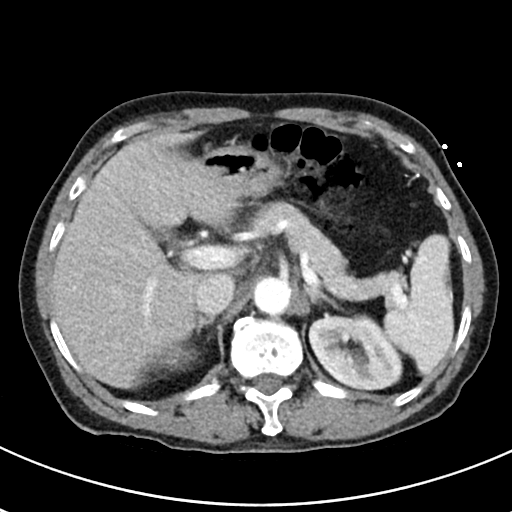
[im 69/85  bone]
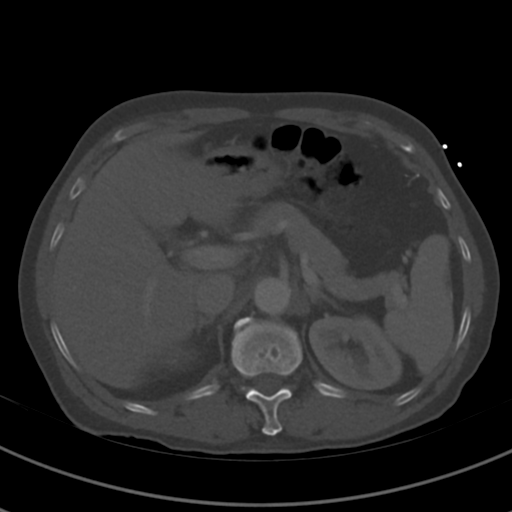
[im 79/85  soft-tissue]
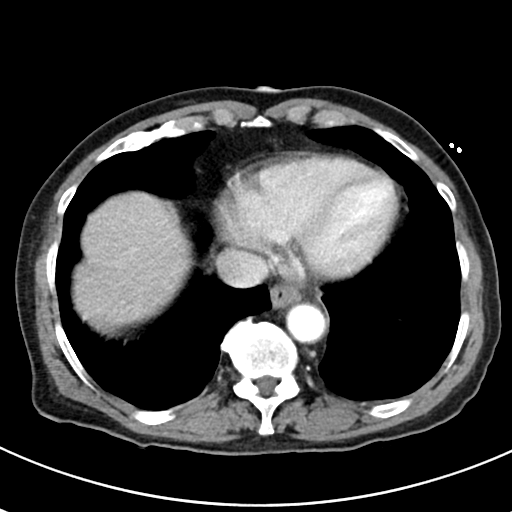

[Series 6: abdomen 3.0 mpr cor · coronal · 0.54mm/px · 3 of 75 slices shown]
[im 25/75  soft-tissue]
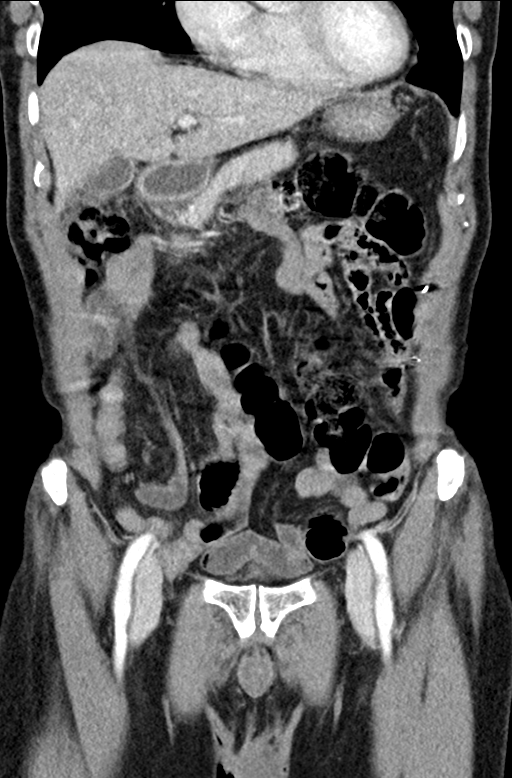
[im 33/75  soft-tissue]
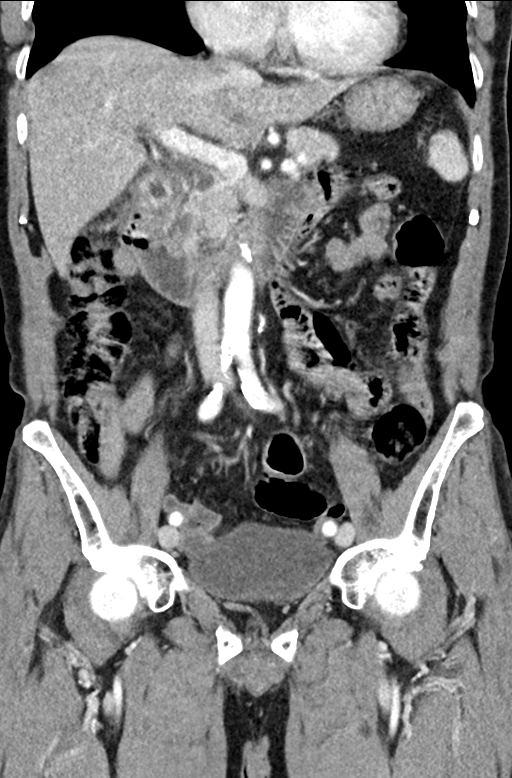
[im 42/75  soft-tissue]
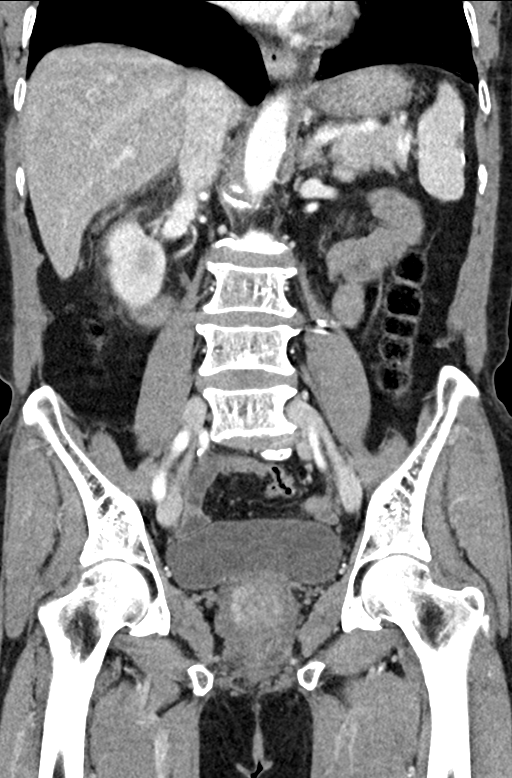

[13 of 46 positions shown; findings below may reference images not displayed]

RADIATION DOSE REDUCTION: This exam was performed according to the
departmental dose-optimization program which includes automated
exposure control, adjustment of the mA and/or kV according to
patient size and/or use of iterative reconstruction technique.

CONTRAST:  75mL OMNIPAQUE IOHEXOL 300 MG/ML  SOLN
FINDINGS: Lower chest: Breathing motion artifact limits detailed assessment.
Allowing for this, no focal airspace disease or pleural effusion.
Minor atelectasis in the medial right middle lobe.

Hepatobiliary: Diffusely decreased hepatic density consistent with
steatosis. No focal liver lesion. There is motion artifact through
the gallbladder, slight edema adjacent to the gallbladder is felt to
be due to adjacent inflamed duodenum rather than primary gallbladder
inflammation. There is no abnormal gallbladder distention or
calcified gallstone. No common bile duct dilatation for age, 7 mm.

Pancreas: No ductal dilatation or inflammation.

Spleen: Subcapsular calcification. Normal in size without suspicious
abnormality.

Adrenals/Urinary Tract: Normal adrenal glands. No hydronephrosis or
perinephric edema. Homogeneous renal enhancement with symmetric
excretion on delayed phase imaging. No visualized renal stone or
focal lesion. Urinary bladder is physiologically distended without
wall thickening.

Stomach/Bowel: Motion artifact through the abdomen and pelvis limits
assessment. The stomach is nondistended but appears thick walled.
There is also likely wall thickening of the distal esophagus. There
is enhancement, wall thickening and adjacent fat stranding about the
duodenum as well as distal stomach. Fat stranding with trace free
fluid in the right upper quadrant related to the wall in all
inflammation. There is a small duodenal diverticulum but no focal
diverticular inflammation. The remainder of the small bowel is
unremarkable without obstruction or obvious inflammation. Short
segment of small bowel extends into the right inguinal canal, series
3, image 63, but no associated wall thickening or obstruction. There
is colonic redundancy with the sigmoid colon coursing into the
central upper abdomen. Small to moderate colonic stool burden.
Minimal sigmoid diverticulosis without diverticulitis. No colonic
inflammatory change.

Vascular/Lymphatic: Aortic and branch atherosclerosis. No aortic
aneurysm. Patent portal and splenic veins. Superior mesenteric vein
is also patent. No bulky abdominopelvic adenopathy.

Reproductive: Prominent prostate gland spans 4.3 cm.

Other: Fat stranding and inflammatory changes in the right upper
quadrant related to distal stomach and duodenal inflammation. Trace
reactive free fluid. No perforation or abscess. There is trace free
fluid in the dependent pelvis. Postsurgical change of the midline
abdominal wall, surgical clips noted in the left mid abdominal wall.

Musculoskeletal: Chronic bilateral L5 pars interarticularis defects
with grade 1 anterolisthesis of L5 on S1. Lumbar degenerative
change. There is scattered Schmorl's nodes. There are no acute or
suspicious osseous abnormalities.
IMPRESSION: 1. Wall thickening, enhancement, and adjacent fat stranding about
the distal stomach and duodenum suspicious for gastritis/duodenitis.
No perforation or abscess.
2. Additional wall thickening of the more proximal stomach as well
as distal esophagus. Query peptic ulcer disease.
3. Hepatic steatosis.
4. Short segment of small bowel extends into the right inguinal
canal, but no associated wall thickening or obstruction.
5. Minimal sigmoid colonic diverticulosis without diverticulitis.
6. Chronic bilateral L5 pars interarticularis defects with grade 1
anterolisthesis of L5 on S1.

Aortic Atherosclerosis ([id number]).

## 2022-08-07 IMAGING — CR DG CHEST 2V
2 series · 2 of 2 positions shown · non-contrast
Comparison: Chest x-ray with ribs 04/20/2016.

CLINICAL DATA: Chest pain.

EXAM:
CHEST - 2 VIEW

[chest lat]
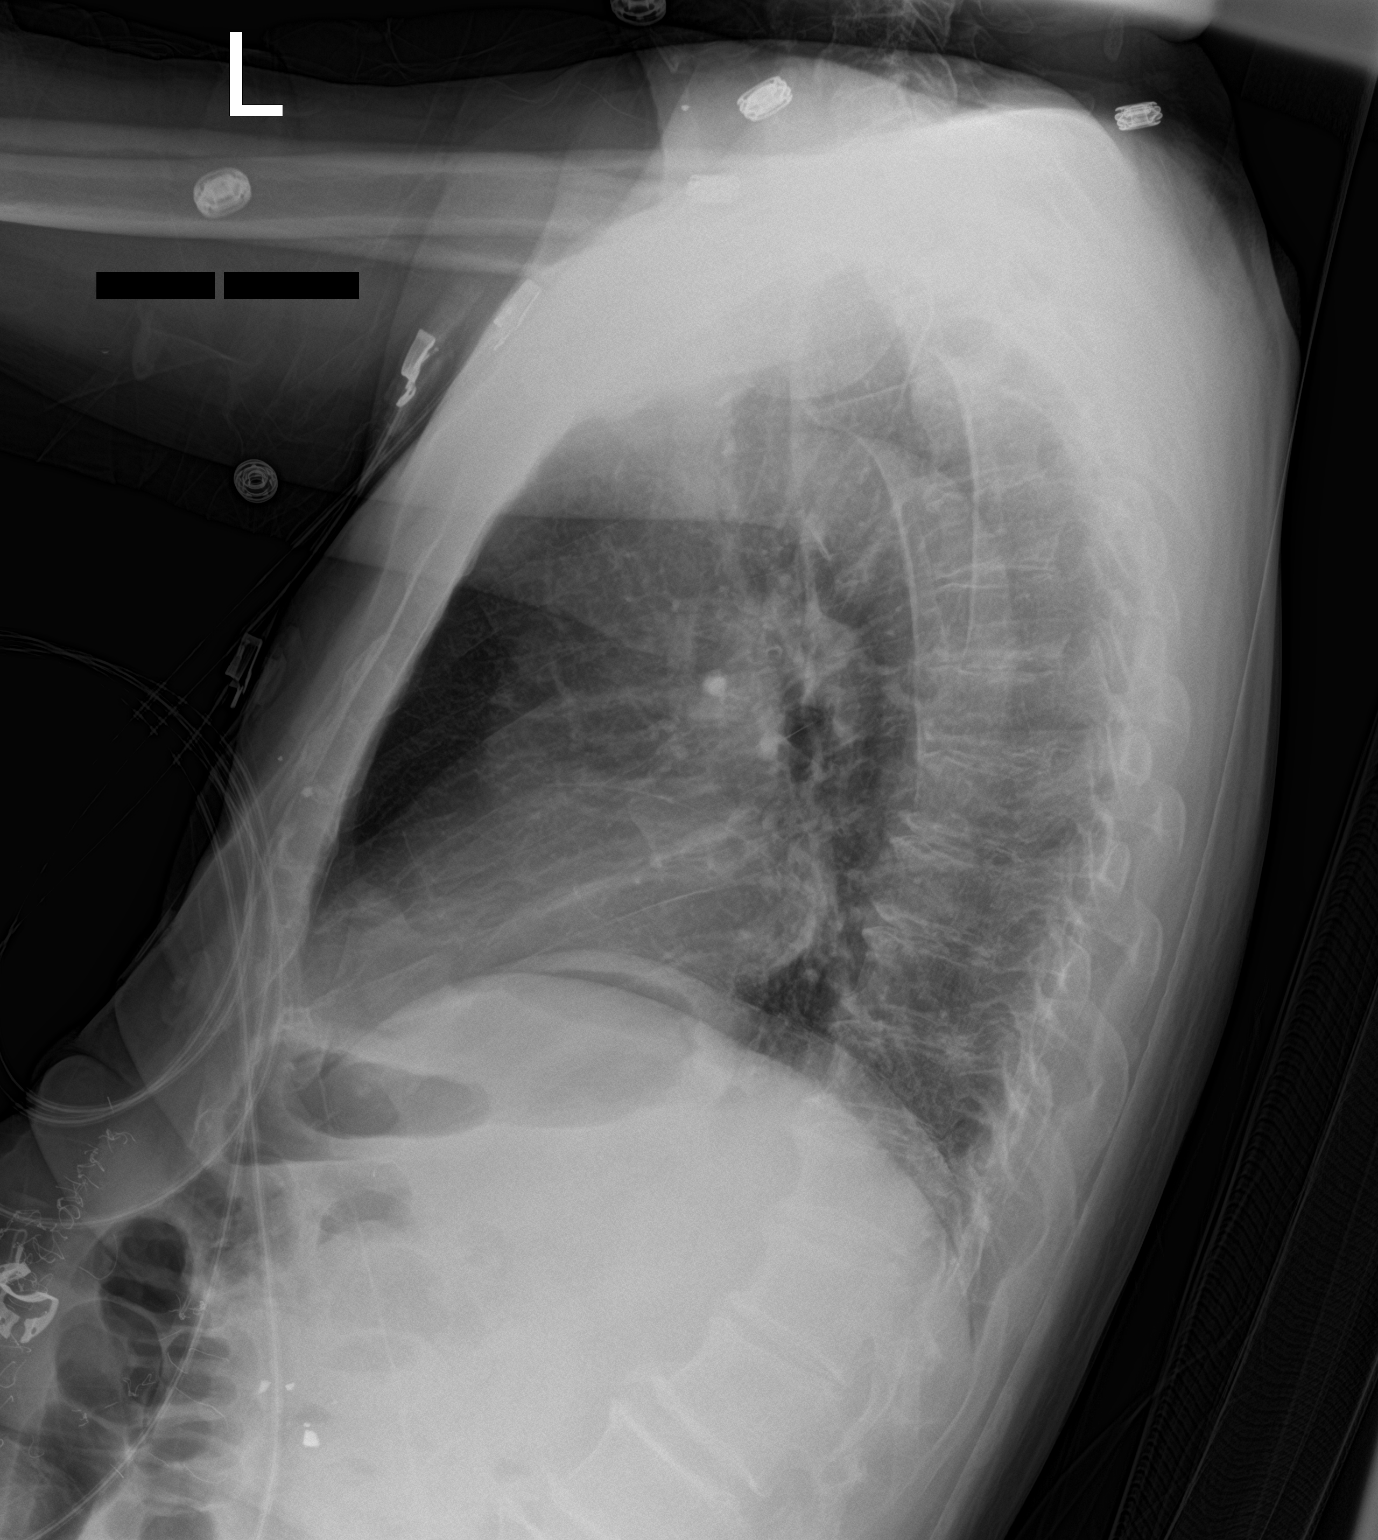

[chest ap]
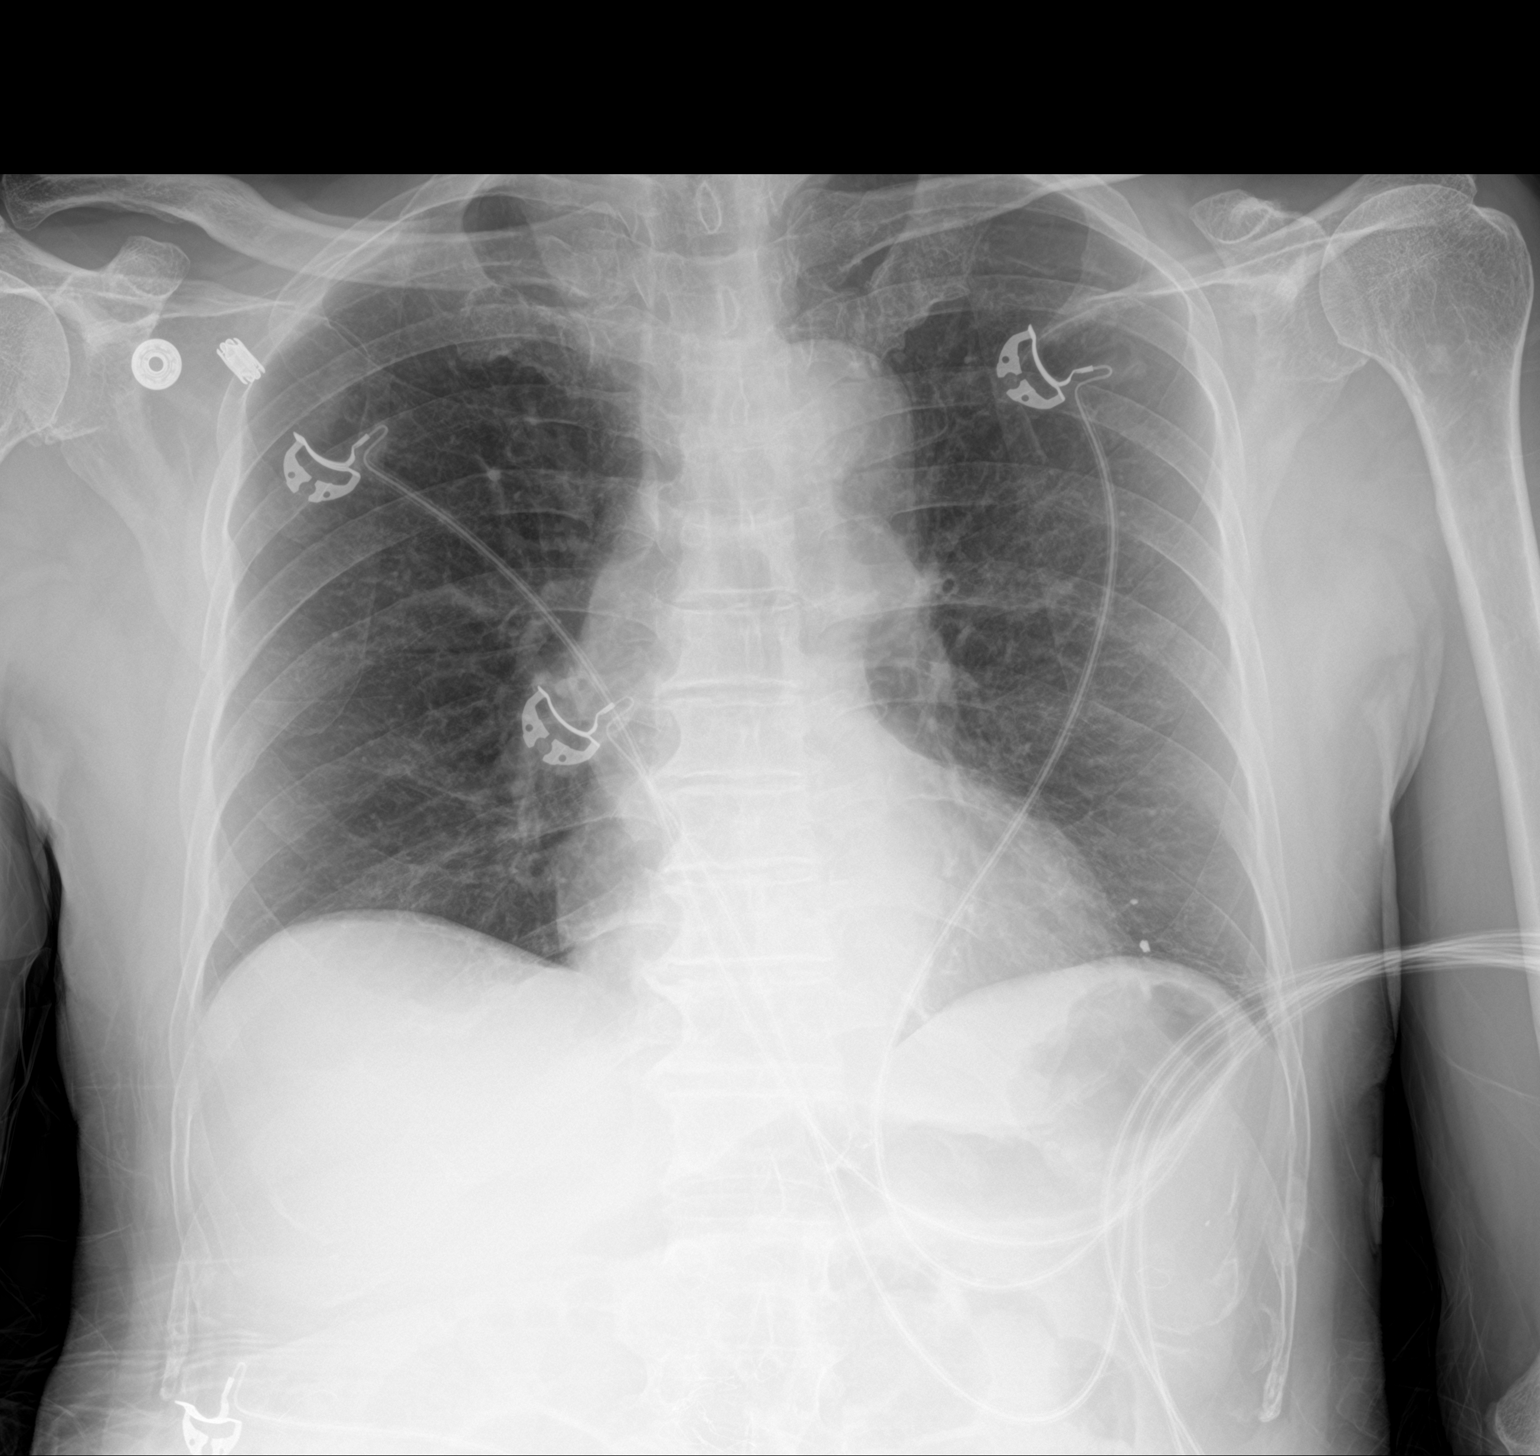

[2 of 2 positions shown; findings below may reference images not displayed]

FINDINGS: The heart size and mediastinal contours are within normal limits.
Both lungs are clear. The visualized skeletal structures are
unremarkable. Small radiopaque densities are seen in the left
anterior chest wall, unchanged. There are surgical changes in the
upper abdomen.
IMPRESSION: No active cardiopulmonary disease.

## 2023-01-22 ENCOUNTER — Ambulatory Visit (INDEPENDENT_AMBULATORY_CARE_PROVIDER_SITE_OTHER): Payer: 59 | Admitting: Student

## 2023-01-22 ENCOUNTER — Ambulatory Visit: Payer: 59

## 2023-01-22 ENCOUNTER — Encounter: Payer: Self-pay | Admitting: Student

## 2023-01-22 VITALS — BP 153/84 | HR 71 | Temp 97.9°F | Ht 62.0 in | Wt 115.8 lb

## 2023-01-22 DIAGNOSIS — R7303 Prediabetes: Secondary | ICD-10-CM

## 2023-01-22 DIAGNOSIS — J302 Other seasonal allergic rhinitis: Secondary | ICD-10-CM

## 2023-01-22 DIAGNOSIS — Z131 Encounter for screening for diabetes mellitus: Secondary | ICD-10-CM

## 2023-01-22 DIAGNOSIS — I10 Essential (primary) hypertension: Secondary | ICD-10-CM | POA: Diagnosis not present

## 2023-01-22 DIAGNOSIS — M542 Cervicalgia: Secondary | ICD-10-CM

## 2023-01-22 DIAGNOSIS — G8929 Other chronic pain: Secondary | ICD-10-CM

## 2023-01-22 MED ORDER — DICLOFENAC SODIUM 1 % EX GEL: 2.0000 g | Freq: Four times a day (QID) | CUTANEOUS | 3 refills | Status: DC

## 2023-01-22 MED ORDER — AMLODIPINE BESYLATE 5 MG PO TABS: 5.0000 mg | ORAL_TABLET | Freq: Every day | ORAL | 11 refills | Status: DC

## 2023-01-22 MED ORDER — FLUTICASONE PROPIONATE 50 MCG/ACT NA SUSP: 1.0000 | Freq: Every day | NASAL | 2 refills | Status: AC

## 2023-01-22 MED ORDER — LORATADINE 10 MG PO TABS: 10.0000 mg | ORAL_TABLET | Freq: Every day | ORAL | 11 refills | Status: AC
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
## 2023-01-22 NOTE — Patient Instructions (Signed)
Thank you, [name] for allowing [place] to provide your care today. Today we discussed your blood pressure, allergies and neck pain.  -Start taking Amlodipine 5mg  1 tablet one time a day for your high blood pressure   -You can continue taking Tylenol (acetaminophen) 1000 mg every 8 hours if needed for pain -You can use the Voltaren gel 3-4 times a day for your neck pain -Referral to physical therapy ordered today -Start loratadine one time a day and Flonase 1 spray in each nose for your nasal congestion -Will do blood work today to check kidney and electrolytes  I have ordered the following labs for you:  Lab Orders         BMP8+Anion Gap         POC Hbg A1C      Referrals ordered today:   Referral Orders         Ambulatory referral to Physical Therapy      I have ordered the following medication/changed the following medications:   Stop the following medications: Medications Discontinued During This Encounter  Medication Reason   meloxicam (MOBIC) 7.5 MG tablet      Start the following medications: Meds ordered this encounter  Medications   amLODipine (NORVASC) 5 MG tablet    Sig: Take 1 tablet (5 mg total) by mouth daily.    Dispense:  30 tablet    Refill:  11   diclofenac Sodium (VOLTAREN) 1 % GEL    Sig: Apply 2 g topically 4 (four) times daily.    Dispense:  100 g    Refill:  3   fluticasone (FLONASE) 50 MCG/ACT nasal spray    Sig: Place 1 spray into both nostrils daily.    Dispense:  11.1 mL    Refill:  2   loratadine (CLARITIN) 10 MG tablet    Sig: Take 1 tablet (10 mg total) by mouth daily.    Dispense:  30 tablet    Refill:  11     Follow up:  1-2 weeks    Should you have any questions or concerns please call the internal medicine clinic at [phone].    Rana Snare, D.O. [facility]

## 2023-01-23 ENCOUNTER — Encounter: Payer: Self-pay | Admitting: Student

## 2023-01-23 DIAGNOSIS — Z131 Encounter for screening for diabetes mellitus: Secondary | ICD-10-CM

## 2023-01-23 DIAGNOSIS — I10 Essential (primary) hypertension: Secondary | ICD-10-CM

## 2023-01-23 DIAGNOSIS — R7303 Prediabetes: Secondary | ICD-10-CM | POA: Insufficient documentation

## 2023-01-23 DIAGNOSIS — J302 Other seasonal allergic rhinitis: Secondary | ICD-10-CM | POA: Insufficient documentation

## 2023-01-23 DIAGNOSIS — G8929 Other chronic pain: Secondary | ICD-10-CM | POA: Insufficient documentation

## 2023-01-23 HISTORY — DX: Essential (primary) hypertension: I10

## 2023-01-23 HISTORY — DX: Encounter for screening for diabetes mellitus: Z13.1

## 2023-01-23 LAB — BMP8+ANION GAP
Anion Gap: 20 mmol/L — ABNORMAL HIGH (ref 10.0–18.0)
BUN/Creatinine Ratio: 18 (ref 10–24)
BUN: 16 mg/dL (ref 8–27)
CO2: 20 mmol/L (ref 20–29)
Calcium: 9.5 mg/dL (ref 8.6–10.2)
Chloride: 104 mmol/L (ref 96–106)
Creatinine, Ser: 0.88 mg/dL (ref 0.76–1.27)
Glucose: 88 mg/dL (ref 70–99)
Potassium: 3.9 mmol/L (ref 3.5–5.2)
Sodium: 144 mmol/L (ref 134–144)
eGFR: 86 mL/min/{1.73_m2} (ref >59)

## 2023-01-23 LAB — HEMOGLOBIN A1C
Est. average glucose Bld gHb Est-mCnc: 123 mg/dL
Hgb A1c MFr Bld: 5.9 % — ABNORMAL HIGH (ref 4.8–5.6)

## 2023-01-23 NOTE — Assessment & Plan Note (Signed)
Repeat BP remains elevated 153/84 from 165/95. Not on any antihypertensives. In 2023, was prescribed HCTZ during ED visit but never followed up. Creatinine elevated 1.3 in 11/2021, unclear of baseline. Patient agreeable to starting amlodipine 5 mg daily.  Plan -start amlodipine 5 mg daily -BMP today

## 2023-01-23 NOTE — Assessment & Plan Note (Addendum)
Patient agreeable to screening A1c. Denies prior hx of prediabetes or diabetes. A1c resulted at 5.9%, will discuss with patient regarding lifestyle changes.

## 2023-01-23 NOTE — Assessment & Plan Note (Signed)
Patient states chronic neck pain that has been more persistent after work related injury several years ago. Denies any radicular symptoms. Exam showed tight paraspinal musculature of lower cervical and upper thoracic but no rashes, erythema, or obvious deformity. 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 Has tried tylenol PRN with relief. He is able to still work and do daily activities. Will continue supportive therapy and may benefit with PT.   Plan -start voltaren gel and PRN tylenol  -PT referral placed today

## 2023-01-23 NOTE — Assessment & Plan Note (Signed)
Endorses nasal congestion and fullness along with cough that is more pronounced during pollen season. No fever or recent illness or sick contacts. Lungs were CTAB today. Does not take any OTC meds. Works in Aeronautical engineer. Presentation most consistent with allergic rhinitis. Will start oral antihistamine and nasal spray.  Plan -start loratadine 10 mg daily and Flonase daily

## 2023-01-25 ENCOUNTER — Ambulatory Visit: Payer: 59

## 2023-01-25 VITALS — BP 153/84 | HR 72 | Temp 97.9°F | Ht 62.0 in | Wt 115.2 lb

## 2023-01-25 DIAGNOSIS — Z0000 Encounter for general adult medical examination without abnormal findings: Secondary | ICD-10-CM | POA: Diagnosis not present

## 2023-01-25 NOTE — Addendum Note (Signed)
Addended by: Rana Snare on: 01/25/2023 04:27 PM   Modules accepted: Level of Service

## 2023-01-25 NOTE — Progress Notes (Signed)
Subjective:   Maxwell Jackson is a 82 y.o. male who presents for an Initial Medicare Annual Wellness Visit. I connected with  Maxwell Jackson on 01/25/23 by a  Face-To-Face encounter  enabled telemedicine application and verified that I am speaking with the correct person using two identifiers.  Patient Location: Other:  Office/Clinic  Provider Location: Office/Clinic  I discussed the limitations of evaluation and management by telemedicine. The patient expressed understanding and agreed to proceed.  Review of Systems    Defer to PCP       Objective:    Today's Vitals   01/25/23 1529 01/25/23 1530  BP: (!) 165/95 (!) 153/84  Pulse: 72   Temp: 97.9 F (36.6 C)   TempSrc: Oral   SpO2: 97%   Weight: 115 lb 2.8 oz (52.2 kg)   Height: 5\' 2"  (1.575 m)   PainSc:  7    Body mass index is 21.07 kg/m.     01/25/2023    3:31 PM 01/22/2023   11:00 AM 12/02/2015    9:52 AM 02/10/2015    9:27 AM  Advanced Directives  Does Patient Have a Medical Advance Directive? No No No No  Would patient like information on creating a medical advance directive? No - Patient declined No - Patient declined      Current Medications (verified) Outpatient Encounter Medications as of 01/25/2023  Medication Sig   amLODipine (NORVASC) 5 MG tablet Take 1 tablet (5 mg total) by mouth daily.   benzonatate (TESSALON) 100 MG capsule Take 1-2 capsules (100-200 mg total) by mouth 3 (three) times daily as needed for cough. (Patient not taking: Reported on 05/13/2022)   diclofenac Sodium (VOLTAREN) 1 % GEL Apply 2 g topically 4 (four) times daily.   esomeprazole (NEXIUM) 40 MG capsule Take 1 capsule (40 mg total) by mouth daily. (Patient not taking: Reported on 05/13/2022)   fluticasone (FLONASE) 50 MCG/ACT nasal spray Place 1 spray into both nostrils daily.   loratadine (CLARITIN) 10 MG tablet Take 1 tablet (10 mg total) by mouth daily.   No facility-administered encounter medications on file as of 01/25/2023.    Allergies  (verified) Patient has no known allergies.   History: Past Medical History:  Diagnosis Date   Asthma    Hypertension 01/23/2023   Past Surgical History:  Procedure Laterality Date   ABDOMINAL SURGERY     History reviewed. No pertinent family history. Social History   Socioeconomic History   Marital status: Married    Spouse name: Not on file   Number of children: Not on file   Years of education: Not on file   Highest education level: Not on file  Occupational History   Not on file  Tobacco Use   Smoking status: Never   Smokeless tobacco: Never  Vaping Use   Vaping Use: Never used  Substance and Sexual Activity   Alcohol use: No   Drug use: No   Sexual activity: Never  Other Topics Concern   Not on file  Social History Narrative   Not on file   Social Determinants of Health   Financial Resource Strain: High Risk (01/25/2023)   Overall Financial Resource Strain (CARDIA)    Difficulty of Paying Living Expenses: Hard  Food Insecurity: No Food Insecurity (01/25/2023)   Hunger Vital Sign    Worried About Running Out of Food in the Last Year: Never true    Ran Out of Food in the Last Year: Never true  Transportation Needs: Unmet  Transportation Needs (01/25/2023)   PRAPARE - Administrator, Civil Service (Medical): Yes    Lack of Transportation (Non-Medical): Yes  Physical Activity: Sufficiently Active (01/25/2023)   Exercise Vital Sign    Days of Exercise per Week: 3 days    Minutes of Exercise per Session: 120 min  Stress: Stress Concern Present (01/25/2023)   [employer] of Occupational Health - Occupational Stress Questionnaire    Feeling of Stress : To some extent  Social Connections: Socially Integrated (01/25/2023)   Social Connection and Isolation Panel [NHANES]    Frequency of Communication with Friends and Family: More than three times a week    Frequency of Social Gatherings with Friends and Family: Once a week    Attends Religious Services:  More than 4 times per year    Active Member of [employer] or Organizations: Yes    Attends Engineer, structural: More than 4 times per year    Marital Status: Married    Tobacco Counseling Counseling given: Not Answered   Clinical Intake:  Pre-visit preparation completed: Yes  Pain : 0-10 Pain Score: 7  Pain Type: Acute pain Pain Location: Neck Pain Orientation: Posterior Pain Descriptors / Indicators: Aching Pain Onset: More than a month ago Pain Frequency: Intermittent Pain Relieving Factors: tylenol  Pain Relieving Factors: tylenol  Nutritional Risks: None Diabetes: No  How often do you need to have someone help you when you read instructions, pamphlets, or other written materials from your doctor or pharmacy?: 5 - Always What is the last grade level you completed in school?: no school  Diabetic?No  Interpreter Needed?: Yes Interpreter Agency: [employer] Interpreter Name: [email or web address] eban Patient Declined Interpreter : No Patient signed [place] waiver: No  Information entered by :: Daanish Copes,cma   Activities of Daily Living    01/25/2023    3:32 PM 01/22/2023   11:03 AM  In your present state of health, do you have any difficulty performing the following activities:  Hearing? 0 0  Vision? 1 1  Difficulty concentrating or making decisions? 0 0  Walking or climbing stairs? 0 0  Dressing or bathing? 0 0  Doing errands, shopping? 1 1    Patient Care Team: Rana Snare, DO as PCP - General  Indicate any recent Medical Services you may have received from other than [facility] providers in the past year (date may be approximate).     Assessment:   This is a routine wellness examination for Maxwell Jackson.  Hearing/Vision screen No results found.  Dietary issues and exercise activities discussed:     Goals Addressed   None   Depression Screen    01/25/2023    3:32 PM 01/22/2023   11:03 AM 05/06/2016   11:52 AM 04/20/2016   11:30 AM 04/08/2016    12:09 PM  PHQ 2/9 Scores  PHQ - 2 Score 0 0 0 0 0    Fall Risk    01/25/2023    3:32 PM 01/22/2023   11:03 AM 05/06/2016   11:52 AM 04/20/2016   11:30 AM 04/08/2016   12:09 PM  Fall Risk   Falls in the past year? 1 1 No No Yes  Number falls in past yr: 0 0     Injury with Fall? 1 1     Risk for fall due to : Other (Comment) Other (Comment)     Risk for fall due to: Comment slipped Slipped.     Follow up Falls evaluation  completed;Falls prevention discussed Falls evaluation completed;Falls prevention discussed       FALL RISK PREVENTION PERTAINING TO THE HOME:  Any stairs in or around the home? No  If so, are there any without handrails? No  Home free of loose throw rugs in walkways, pet beds, electrical cords, etc? Yes  Adequate lighting in your home to reduce risk of falls? Yes   ASSISTIVE DEVICES UTILIZED TO PREVENT FALLS:  Life alert? No  Use of a cane, walker or w/c? No  Grab bars in the bathroom? No  Shower chair or bench in shower? No  Elevated toilet seat or a handicapped toilet? No   TIMED UP AND GO:  Was the test performed? No .  Length of time to ambulate 10 feet: 0 sec.   Gait slow and steady without use of assistive device  Cognitive Function:        Immunizations Immunization History  Administered Date(s) Administered   Janssen (J&J) SARS-COV-2 Vaccination 12/02/2019    TDAP status: Due, Education has been provided regarding the importance of this vaccine. Advised may receive this vaccine at local pharmacy or Health Dept. Aware to provide a copy of the vaccination record if obtained from local pharmacy or Health Dept. Verbalized acceptance and understanding.  Flu Vaccine status: Up to date  Pneumococcal vaccine status: Due, Education has been provided regarding the importance of this vaccine. Advised may receive this vaccine at local pharmacy or Health Dept. Aware to provide a copy of the vaccination record if obtained from local pharmacy or Health  Dept. Verbalized acceptance and understanding.  Covid-19 vaccine status: Information provided on how to obtain vaccines.   Qualifies for Shingles Vaccine? No   Zostavax completed No   Shingrix Completed?: No.    Education has been provided regarding the importance of this vaccine. Patient has been advised to call insurance company to determine out of pocket expense if they have not yet received this vaccine. Advised may also receive vaccine at local pharmacy or Health Dept. Verbalized acceptance and understanding.  Screening Tests Health Maintenance  Topic Date Due   DTaP/Tdap/Td (1 - Tdap) Never done   Zoster Vaccines- Shingrix (1 of 2) Never done   Pneumonia Vaccine 54+ Years old (1 of 1 - PCV) Never done   COVID-19 Vaccine (2 - 2023-24 season) 05/08/2022   INFLUENZA VACCINE  04/08/2023   Medicare Annual Wellness (AWV)  01/25/2024   HPV VACCINES  Aged Out    Health Maintenance  Health Maintenance Due  Topic Date Due   DTaP/Tdap/Td (1 - Tdap) Never done   Zoster Vaccines- Shingrix (1 of 2) Never done   Pneumonia Vaccine 79+ Years old (1 of 1 - PCV) Never done   COVID-19 Vaccine (2 - 2023-24 season) 05/08/2022      Lung Cancer Screening: (Low Dose CT Chest recommended if Age 76-80 years, 30 pack-year currently smoking OR have quit w/in 15years.) does not qualify.   Lung Cancer Screening Referral: N/A  Additional Screening:  Hepatitis C Screening: does not qualify; Completed N/A  Vision Screening: Recommended annual ophthalmology exams for early detection of glaucoma and other disorders of the eye. Is the patient up to date with their annual eye exam?  No  Who is the provider or what is the name of the office in which the patient attends annual eye exams? N/A If pt is not established with a provider, would they like to be referred to a provider to establish care? No .  Dental Screening: Recommended annual dental exams for proper oral hygiene  Community Resource Referral  / Chronic Care Management: CRR required this visit?  No   CCM required this visit?  No      Plan:     I have personally reviewed and noted the following in the patient's chart:   Medical and social history Use of alcohol, tobacco or illicit drugs  Current medications and supplements including opioid prescriptions. Patient is not currently taking opioid prescriptions. Functional ability and status Nutritional status Physical activity Advanced directives List of other physicians Hospitalizations, surgeries, and ER visits in previous 12 months Vitals Screenings to include cognitive, depression, and falls Referrals and appointments  In addition, I have reviewed and discussed with patient certain preventive protocols, quality metrics, and best practice recommendations. A written personalized care plan for preventive services as well as general preventive health recommendations were provided to patient.     Cala Bradford, CMA   01/25/2023   Nurse Notes: Face-To-Face Visit

## 2023-01-26 NOTE — Progress Notes (Signed)
Internal Medicine Clinic Attending  Case discussed with Dr. Zheng  At the time of the visit.  We reviewed the resident's history and exam and pertinent patient test results.  I agree with the assessment, diagnosis, and plan of care documented in the resident's note.  

## 2023-01-28 ENCOUNTER — Encounter: Payer: Self-pay | Admitting: Student

## 2023-02-05 ENCOUNTER — Encounter: Payer: Self-pay | Admitting: Student

## 2023-02-05 ENCOUNTER — Ambulatory Visit (INDEPENDENT_AMBULATORY_CARE_PROVIDER_SITE_OTHER): Payer: 59 | Admitting: Student

## 2023-02-05 VITALS — BP 143/74 | HR 83 | Temp 98.2°F | Wt 113.4 lb

## 2023-02-05 DIAGNOSIS — R7303 Prediabetes: Secondary | ICD-10-CM | POA: Diagnosis not present

## 2023-02-05 DIAGNOSIS — K219 Gastro-esophageal reflux disease without esophagitis: Secondary | ICD-10-CM | POA: Insufficient documentation

## 2023-02-05 DIAGNOSIS — I10 Essential (primary) hypertension: Secondary | ICD-10-CM | POA: Diagnosis not present

## 2023-02-05 MED ORDER — AMLODIPINE-OLMESARTAN 5-20 MG PO TABS: 1.0000 | ORAL_TABLET | Freq: Every day | ORAL | 11 refills | Status: DC

## 2023-02-05 MED ORDER — ESOMEPRAZOLE MAGNESIUM 40 MG PO CPDR: 40.0000 mg | DELAYED_RELEASE_CAPSULE | Freq: Every day | ORAL | 11 refills | Status: DC

## 2023-02-05 NOTE — Progress Notes (Signed)
   CC: HTN f/u  HPI:  Maxwell Jackson is a 82 y.o. male living with a history stated below and presents today for HTN f/u. Please see problem based assessment and plan for additional details.  In person interpreter used during this encounter.  Past Medical History:  Diagnosis Date   Asthma    Hypertension 01/23/2023    Current Outpatient Medications on File Prior to Visit  Medication Sig Dispense Refill   benzonatate (TESSALON) 100 MG capsule Take 1-2 capsules (100-200 mg total) by mouth 3 (three) times daily as needed for cough. (Patient not taking: Reported on 05/13/2022) 40 capsule 0   diclofenac Sodium (VOLTAREN) 1 % GEL Apply 2 g topically 4 (four) times daily. 100 g 3   fluticasone (FLONASE) 50 MCG/ACT nasal spray Place 1 spray into both nostrils daily. 11.1 mL 2   loratadine (CLARITIN) 10 MG tablet Take 1 tablet (10 mg total) by mouth daily. 30 tablet 11   No current facility-administered medications on file prior to visit.   Review of Systems: ROS negative except for what is noted on the assessment and plan.  Vitals:   02/05/23 0938  BP: (!) 143/74  Pulse: 83  Temp: 98.2 F (36.8 C)  TempSrc: Oral  SpO2: 95%  Weight: 113 lb 6.4 oz (51.4 kg)   Physical Exam: Constitutional: well-appearing male sitting in chair comfortably, in no acute distress HENT: normocephalic atraumatic Cardiovascular: regular rate and rhythm Pulmonary/Chest: normal work of breathing on room air, lungs clear to auscultation bilaterally MSK: normal bulk and tone Neurological: alert & oriented x 3 Skin: warm and dry Psych: pleasant mood  Assessment & Plan:   Hypertension BP remains elevated 143/74.  He is taking amlodipine 5 mg daily, tolerating well and reports daily adherence.  Patient denies any lightheadedness, dizziness or headache. No nausea or vomiting. BMP from 01/23/2023 was unremarkable.  Discussed with patient about adding on a second agent to improve BP control.  Plan -Switch to  amlodipine-olmesartan 5-20 mg daily -F/u at next OV to reassess and if need to titrate up, repeat BMP then   Prediabetes A1c checked 2 weeks ago was 5.9%.  Discussed with patient regarding healthier eating habits and exercise. He remains active and currently working for [employer]. Patient will work on lifestyle changes before initiating medication.   GERD (gastroesophageal reflux disease) Patient reports chronic history of acid reflux for several years.  States burning sensation that is intermittent but usually after meals.  Denies any nausea, vomiting or abdominal pain.  No new changes at this time.  He has taken Nexium before with improvement but did not have refills. Refill for Nexium sent to pharmacy.    Patient discussed with Dr. Nida Jackson, D.O. [facility], PGY-1 Phone: [phone] Date 02/05/2023 Time 4:11 PM

## 2023-02-05 NOTE — Patient Instructions (Addendum)
Thank you, Mr.Susana Bradby for allowing [place] to provide your care today. Today we discussed your blood pressure.  -STOP the amlodipine 5 mg you have at home.  -Start amlodipine-olmesartan 5-20 mg one time a day.  -Refill for your heartburn medication sent to pharmacy.  -Contact the office if you have questions.   I have ordered the following medication/changed the following medications:   Stop the following medications: Medications Discontinued During This Encounter  Medication Reason   amLODipine (NORVASC) 5 MG tablet Change in therapy   esomeprazole (NEXIUM) 40 MG capsule Reorder   esomeprazole (NEXIUM) 40 MG capsule Reorder     Start the following medications: Meds ordered this encounter  Medications   amLODipine-olmesartan (AZOR) 5-20 MG tablet    Sig: Take 1 tablet by mouth daily.    Dispense:  30 tablet    Refill:  11   DISCONTD: esomeprazole (NEXIUM) 40 MG capsule    Sig: Take 1 capsule (40 mg total) by mouth daily.    Dispense:  30 capsule    Refill:  11   esomeprazole (NEXIUM) 40 MG capsule    Sig: Take 1 capsule (40 mg total) by mouth daily.    Dispense:  30 capsule    Refill:  11     Follow up:  2-4 weeks    Should you have any questions or concerns please call the internal medicine clinic at [phone].    Rana Snare, D.O. [facility]

## 2023-02-05 NOTE — Assessment & Plan Note (Addendum)
A1c checked 2 weeks ago was 5.9%.  Discussed with patient regarding healthier eating habits and exercise. He remains active and currently working for [employer]. Patient will work on lifestyle changes before initiating medication.

## 2023-02-05 NOTE — Assessment & Plan Note (Signed)
Patient reports chronic history of acid reflux for several years.  States burning sensation that is intermittent but usually after meals.  Denies any nausea, vomiting or abdominal pain.  No new changes at this time.  He has taken Nexium before with improvement but did not have refills. Refill for Nexium sent to pharmacy.

## 2023-02-05 NOTE — Assessment & Plan Note (Addendum)
BP remains elevated 143/74.  He is taking amlodipine 5 mg daily, tolerating well and reports daily adherence.  Patient denies any lightheadedness, dizziness or headache. No nausea or vomiting. BMP from 01/23/2023 was unremarkable.  Discussed with patient about adding on a second agent to improve BP control.  Plan -Switch to amlodipine-olmesartan 5-20 mg daily -F/u at next OV to reassess and if need to titrate up, repeat BMP then

## 2023-02-08 NOTE — Progress Notes (Signed)
Internal Medicine Clinic Attending  Case discussed with Dr. Zheng  At the time of the visit.  We reviewed the resident's history and exam and pertinent patient test results.  I agree with the assessment, diagnosis, and plan of care documented in the resident's note.  

## 2023-03-05 ENCOUNTER — Ambulatory Visit (INDEPENDENT_AMBULATORY_CARE_PROVIDER_SITE_OTHER): Payer: 59 | Admitting: Student

## 2023-03-05 ENCOUNTER — Encounter: Payer: Self-pay | Admitting: Student

## 2023-03-05 VITALS — BP 149/71 | HR 69 | Temp 98.8°F | Ht 62.0 in | Wt 117.3 lb

## 2023-03-05 DIAGNOSIS — M542 Cervicalgia: Secondary | ICD-10-CM | POA: Diagnosis not present

## 2023-03-05 DIAGNOSIS — I10 Essential (primary) hypertension: Secondary | ICD-10-CM | POA: Diagnosis not present

## 2023-03-05 DIAGNOSIS — M5136 Other intervertebral disc degeneration, lumbar region: Secondary | ICD-10-CM | POA: Diagnosis not present

## 2023-03-05 DIAGNOSIS — G8929 Other chronic pain: Secondary | ICD-10-CM | POA: Diagnosis not present

## 2023-03-05 DIAGNOSIS — K219 Gastro-esophageal reflux disease without esophagitis: Secondary | ICD-10-CM

## 2023-03-05 MED ORDER — AMLODIPINE-OLMESARTAN 5-40 MG PO TABS
1.0000 | ORAL_TABLET | Freq: Every day | ORAL | 3 refills | Status: DC
Start: 2023-03-05 — End: 2024-05-22

## 2023-03-05 MED ORDER — ESOMEPRAZOLE MAGNESIUM 40 MG PO CPDR: 40.0000 mg | DELAYED_RELEASE_CAPSULE | Freq: Every day | ORAL | 11 refills | Status: DC

## 2023-03-05 NOTE — Assessment & Plan Note (Signed)
Patient continues to endorse symptoms of acid reflux despite PPI therapy. He notes burning sensation in throat predominately after meals and with laying down. Does not appear to have had H pylori testing in the past. Given failure of PPI therapy, will refer to GI for endoscopic evaluation.  Plan: -continue nexium -referral to GI

## 2023-03-05 NOTE — Assessment & Plan Note (Addendum)
Currently on amlodipine-olmesartan 5-20mg  daily. BP is elevated today at 140/85 with repeat 149/71. Will increase olmesartan dose to 40mg  daily for better control.   He does endorse some orthostatic symptoms at work. 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 He drinks about 1L of water in an entire day. Despite good BP in clinic today, suspect that he may become orthostatic at work due to low PO fluid intake and outdoor work in the heat. Have advised him to increase fluid intake. Will avoid adjusting amlodipine dose to prevent worsening of orthostatic symptoms at work. Have advised him to inform clinic should symptoms persist despite increasing fluid intake.  Plan: -increase to amlodipine-olmesartan 5-40mg  daily -increase water intake -repeat BP and obtain BMP at next visit in 1 month

## 2023-03-05 NOTE — Patient Instructions (Addendum)
Maxwell Jackson,  It was a pleasure seeing you in the clinic today.   I have placed a referral to physical therapy to help with your neck pain. Please also try using a heating pad to help. Drink plenty of water at home and at work to make sure you do not get dehydrated. I have placed a referral to the stomach doctor so that they can help with your acid reflux symptoms. Please come back in 1 month for your next visit.  Please call our clinic at [phone] if you have any questions or concerns. The best time to call is Monday-Friday from 9am-4pm, but there is someone available 24/7 at the same number. If you need medication refills, please notify your pharmacy one week in advance and they will send [place] a request.   Thank you for letting us take part in your care. We look forward to seeing you next time!

## 2023-03-05 NOTE — Progress Notes (Signed)
   CC: f/u HTN, chronic neck pain  HPI:  [name] is a 82 y.o. male with history listed below presenting to the [facility] for f/u HTN, chronic neck pain. Please see individualized problem based charting for full HPI.  Past Medical History:  Diagnosis Date   Asthma    Hypertension 01/23/2023   Screening for diabetes mellitus (DM) 01/23/2023    Review of Systems:  Negative aside from that listed in individualized problem based charting.  Physical Exam:  Vitals:   03/05/23 0839 03/05/23 0908  BP: (!) 140/85 (!) 149/71  Pulse: 63 69  Temp: 98.8 F (37.1 C)   TempSrc: Oral   SpO2: 99%   Weight: 117 lb 4.8 oz (53.2 kg)   Height: 5\' 2"  (1.575 m)    Physical Exam Constitutional:      Appearance: He is well-developed and normal weight. He is not ill-appearing.  HENT:     Mouth/Throat:     Mouth: Mucous membranes are moist.     Pharynx: Oropharynx is clear. No oropharyngeal exudate.  Eyes:     General: No scleral icterus.    Extraocular Movements: Extraocular movements intact.     Conjunctiva/sclera: Conjunctivae normal.  Neck:     Comments: No neck rigidity but does have mild TTP of cervical spine, chronic. No step off deformity noted. Cardiovascular:     Rate and Rhythm: Normal rate and regular rhythm.     Heart sounds: Normal heart sounds. No murmur heard.    No friction rub. No gallop.  Pulmonary:     Effort: Pulmonary effort is normal.     Breath sounds: Normal breath sounds. No wheezing, rhonchi or rales.  Abdominal:     General: Bowel sounds are normal. There is no distension.     Palpations: Abdomen is soft.     Tenderness: There is no abdominal tenderness.  Musculoskeletal:        General: No swelling.  Skin:    General: Skin is warm and dry.  Neurological:     General: No focal deficit present.     Mental Status: He is alert.  Psychiatric:        Mood and Affect: Mood normal.        Behavior: Behavior normal.      Assessment & Plan:   See Encounters  Tab for problem based charting.  Patient discussed with Dr.  Sol Blazing

## 2023-03-05 NOTE — Assessment & Plan Note (Signed)
Patient continues to experience chronic neck pain which began multiple years ago after a work related injury. No radicular symptoms. 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 He uses tylenol and voltaren gel at home. Tylenol helps some but voltaren gel provides minimal relief. On exam, he does have mild TTP of cervical spine without any step off deformity. Will refer him to PT and advised him to continue using tylenol prn and heating pad prn.  Plan: -tylenol prn, heating pad prn, voltaren gel -referral to PT

## 2023-03-18 NOTE — Progress Notes (Signed)
Internal Medicine Clinic Attending  Case discussed with Dr. Jinwala  At the time of the visit.  We reviewed the resident's history and exam and pertinent patient test results.  I agree with the assessment, diagnosis, and plan of care documented in the resident's note.  

## 2023-03-19 NOTE — Progress Notes (Signed)
AWV accepted

## 2023-04-02 ENCOUNTER — Ambulatory Visit (INDEPENDENT_AMBULATORY_CARE_PROVIDER_SITE_OTHER): Payer: 59 | Admitting: Student

## 2023-04-02 ENCOUNTER — Encounter: Payer: Self-pay | Admitting: Student

## 2023-04-02 ENCOUNTER — Encounter: Payer: Self-pay | Admitting: Nurse Practitioner

## 2023-04-02 VITALS — BP 136/73 | HR 72 | Temp 98.5°F | Wt 114.9 lb

## 2023-04-02 DIAGNOSIS — M542 Cervicalgia: Secondary | ICD-10-CM | POA: Diagnosis not present

## 2023-04-02 DIAGNOSIS — Z0000 Encounter for general adult medical examination without abnormal findings: Secondary | ICD-10-CM

## 2023-04-02 DIAGNOSIS — I10 Essential (primary) hypertension: Secondary | ICD-10-CM | POA: Diagnosis not present

## 2023-04-02 DIAGNOSIS — G8929 Other chronic pain: Secondary | ICD-10-CM

## 2023-04-02 MED ORDER — DICLOFENAC SODIUM 1 % EX GEL
2.0000 g | Freq: Four times a day (QID) | CUTANEOUS | 11 refills | Status: DC
Start: 2023-04-02 — End: 2024-06-21

## 2023-04-02 NOTE — Assessment & Plan Note (Signed)
Pending PT referral. Provided contact information in case. Refilled voltaren gel since that has been helping him.

## 2023-04-02 NOTE — Patient Instructions (Addendum)
C?m ?n ng, ng Paras Halbert ? cho php chng ti cung c?p d?ch v? ch?m [place] c?a ng ngy hm nay. Hm nay chng ta ? th?o lu?n:  -Huy?t p hm nay t?t h?n. Ti?p t?c dng amlodipine-olmesartan 5-40 mg m?t l?n m?t ngy.  -S? lm xt nghi?m mu hm nay ?? ki?m tra ?i?n gi?i v ch?c n?ng th?n -?? ??y gel voltaren c?a b?n.  -[facility] - ?i?n tho?i: [phone]   -V?t l tr? li?u - ?i?n tho?i: [phone]  Theo di: 4-6 thng  N?u b?n c b?t k? cu h?i ho?c th?c m?c no, vui lng g?i cho phng khm n?i khoa theo s? [phone]. -------------------------------- Thank you, Maxwell Jackson for allowing [place] to provide your care today. Today we discussed:  -Blood pressure is better today. Continue taking amlodipine-olmesartan 5-40 mg one time a day.  -Will do blood work today to check electrolytes and kidney function -Refilled your voltaren gel.  -[facility] - Phone: [phone]   -Physical Therapy - Phone: [phone]  I have ordered the following medication/changed the following medications:  Start the following medications: Meds ordered this encounter  Medications   diclofenac Sodium (VOLTAREN) 1 % GEL    Sig: Apply 2 g topically 4 (four) times daily.    Dispense:  200 g    Refill:  11    Follow up:  4-6 months    Should you have any questions or concerns please call the internal medicine clinic at [phone].    Rana Snare, D.O. [facility]

## 2023-04-02 NOTE — Assessment & Plan Note (Signed)
Discussed risks and benefits of Tdap vaccine and pneumococcal vaccine. Patient voices understanding but declined both vaccines today.

## 2023-04-02 NOTE — Assessment & Plan Note (Signed)
BP better today at 136/73. At last OV, increased olmesartan to 40 mg and continued amlodipine 5 mg. Tolerating well and reports daily adherence. Asymptomatic and no acute concerns today.   Plan -Continue amlodipine-olmesartan 5-40 mg daily -BMP today

## 2023-04-02 NOTE — Progress Notes (Signed)
   CC: HTN f/u  HPI:  [name] is a 82 y.o. male living with a history stated below and presents today for HTN f/u. Please see problem based assessment and plan for additional details.  In person interpreter present during this encounter.   Past Medical History:  Diagnosis Date   Asthma    Hypertension 01/23/2023   Screening for diabetes mellitus (DM) 01/23/2023    Current Outpatient Medications on File Prior to Visit  Medication Sig Dispense Refill   amLODipine-olmesartan (AZOR) 5-40 MG tablet Take 1 tablet by mouth daily. 30 tablet 3   esomeprazole (NEXIUM) 40 MG capsule Take 1 capsule (40 mg total) by mouth daily. 30 capsule 11   fluticasone (FLONASE) 50 MCG/ACT nasal spray Place 1 spray into both nostrils daily. 11.1 mL 2   loratadine (CLARITIN) 10 MG tablet Take 1 tablet (10 mg total) by mouth daily. 30 tablet 11   No current facility-administered medications on file prior to visit.   Review of Systems: ROS negative except for what is noted on the assessment and plan.  Vitals:   04/02/23 0824 04/02/23 0829  BP: (!) 147/75 136/73  Pulse: 75 72  Temp: 98.5 F (36.9 C)   TempSrc: Oral   SpO2: 100%   Weight: 114 lb 14.4 oz (52.1 kg)    Physical Exam: Constitutional: well-appearing male sitting in chair comfortably, in no acute distress Cardiovascular: regular rate and rhythm Pulmonary/Chest: non-labored, on room air, lungs clear to auscultation bilaterally MSK: no LE edema Neurological: alert & oriented x 3 Skin: warm and dry Psych: pleasant mood  Assessment & Plan:   Hypertension BP better today at 136/73. At last OV, increased olmesartan to 40 mg and continued amlodipine 5 mg. Tolerating well and reports daily adherence. Asymptomatic and no acute concerns today.   Plan -Continue amlodipine-olmesartan 5-40 mg daily -BMP today  Chronic neck pain Pending PT referral. Provided contact information in case. Refilled voltaren gel since that has been helping  him.   Healthcare maintenance Discussed risks and benefits of Tdap vaccine and pneumococcal vaccine. Patient voices understanding but declined both vaccines today.    Patient discussed with Dr. Nida Boatman, D.O. [facility], PGY-2 Phone: [phone] Date 04/02/2023 Time 12:41 PM

## 2023-04-05 NOTE — Progress Notes (Signed)
 Internal Medicine Clinic Attending  Case discussed with the resident physician at the time of the visit.  We reviewed the patient's history, exam, and pertinent patient test results.  I agree with the assessment, diagnosis, and plan of care documented in the resident's note.

## 2023-04-09 ENCOUNTER — Telehealth: Payer: Self-pay

## 2023-04-09 NOTE — Telephone Encounter (Signed)
Phone call to DSS. Patient not enrolled in Medicaid transportation program.  CN requested patient be enrolled and assessment was completed.  He is now eligible to use these services for medical appointments.  CN will assist patient in calling to schedule rides due to language barrier.  Brantley Fling RN, Congregational Nurse [phone]

## 2023-05-21 ENCOUNTER — Other Ambulatory Visit: Payer: Self-pay

## 2023-05-21 ENCOUNTER — Emergency Department (HOSPITAL_BASED_OUTPATIENT_CLINIC_OR_DEPARTMENT_OTHER)
Admission: EM | Admit: 2023-05-21 | Discharge: 2023-05-21 | Disposition: A | Discharge status: Home / Self Care | Payer: 59 | Attending: Emergency Medicine | Admitting: Emergency Medicine

## 2023-05-21 ENCOUNTER — Encounter (HOSPITAL_BASED_OUTPATIENT_CLINIC_OR_DEPARTMENT_OTHER): Payer: Self-pay | Admitting: Emergency Medicine

## 2023-05-21 DIAGNOSIS — T63441A Toxic effect of venom of bees, accidental (unintentional), initial encounter: Secondary | ICD-10-CM | POA: Diagnosis not present

## 2023-05-21 DIAGNOSIS — Z7951 Long term (current) use of inhaled steroids: Secondary | ICD-10-CM | POA: Diagnosis not present

## 2023-05-21 DIAGNOSIS — M549 Dorsalgia, unspecified: Secondary | ICD-10-CM | POA: Diagnosis not present

## 2023-05-21 DIAGNOSIS — Z79899 Other long term (current) drug therapy: Secondary | ICD-10-CM | POA: Diagnosis not present

## 2023-05-21 DIAGNOSIS — I10 Essential (primary) hypertension: Secondary | ICD-10-CM | POA: Diagnosis not present

## 2023-05-21 DIAGNOSIS — Z23 Encounter for immunization: Secondary | ICD-10-CM | POA: Insufficient documentation

## 2023-05-21 DIAGNOSIS — T7840XA Allergy, unspecified, initial encounter: Secondary | ICD-10-CM | POA: Diagnosis not present

## 2023-05-21 DIAGNOSIS — T63481A Toxic effect of venom of other arthropod, accidental (unintentional), initial encounter: Secondary | ICD-10-CM

## 2023-05-21 DIAGNOSIS — M79601 Pain in right arm: Secondary | ICD-10-CM | POA: Diagnosis not present

## 2023-05-21 DIAGNOSIS — J45909 Unspecified asthma, uncomplicated: Secondary | ICD-10-CM | POA: Insufficient documentation

## 2023-05-21 MED ORDER — HYDROCORTISONE 1 % EX CREA: TOPICAL_CREAM | CUTANEOUS | 0 refills | Status: DC

## 2023-05-21 MED ORDER — TETANUS-DIPHTH-ACELL PERTUSSIS 5-2.5-18.5 LF-MCG/0.5 IM SUSY
0.5000 mL | PREFILLED_SYRINGE | Freq: Once | INTRAMUSCULAR | Status: AC
  Administered 2023-05-21: 0.5 mL via INTRAMUSCULAR
  Filled 2023-05-21: qty 0.5

## 2023-05-21 MED ORDER — EPINEPHRINE 0.3 MG/0.3ML IJ SOAJ: 0.3000 mg | INTRAMUSCULAR | 0 refills | Status: DC | PRN

## 2023-05-21 MED ORDER — FAMOTIDINE 20 MG PO TABS
20.0000 mg | ORAL_TABLET | Freq: Once | ORAL | Status: AC
  Administered 2023-05-21: 20 mg via ORAL
  Filled 2023-05-21: qty 1

## 2023-05-21 MED ORDER — DIPHENHYDRAMINE-ZINC ACETATE 2-0.1 % EX CREA: 1.0000 | TOPICAL_CREAM | Freq: Three times a day (TID) | CUTANEOUS | 0 refills | Status: DC | PRN

## 2023-05-21 MED ORDER — IBUPROFEN 400 MG PO TABS
600.0000 mg | ORAL_TABLET | Freq: Once | ORAL | Status: AC
  Administered 2023-05-21: 600 mg via ORAL
  Filled 2023-05-21: qty 1

## 2023-05-21 NOTE — ED Provider Notes (Signed)
[place] EMERGENCY DEPARTMENT AT [facility] Provider Note   CSN: [id number] Arrival date & time: 05/21/23  1215     History  Chief Complaint  Patient presents with   Insect Bite    Maxwell Jackson is a 82 y.o. male.  HPI   82 year old male presents emergency department with complaints of multiple yellowjacket stings.  Patient states that he is working outside when he suffered multiple yellowjacket stings to his back as well as his right arm.  States he was seen in urgent care where he was given IM injection of steroids as well as Benadryl and told to come to the emergency department given that he had elevated blood pressure at that time.  Patient states that pain and swelling of his back has improved a little bit since being at the urgent care.  Denies any history of anaphylaxis and states he has been stung before.  Denies any feelings of throat closing on him, chest pain, shortness of breath, abdominal pain, nausea, vomiting.  Still reporting some back and right arm pain.  Initially after the incident as well as at the urgent care, stingers were removed that were observed.  Past medical history significant for hypertension, GERD, asthma, chronic neck pain  Home Medications Prior to Admission medications   Medication Sig Start Date End Date Taking? Authorizing Provider  diphenhydrAMINE-zinc acetate (BENADRYL EXTRA STRENGTH) cream Apply 1 Application topically 3 (three) times daily as needed for itching. 05/21/23  Yes Sherian Maroon A, PA  EPINEPHrine 0.3 mg/0.3 mL IJ SOAJ injection Inject 0.3 mg into the muscle as needed for anaphylaxis. 05/21/23  Yes Sherian Maroon A, PA  hydrocortisone cream 1 % Apply to affected area 2 times daily 05/21/23  Yes Sherian Maroon A, PA  amLODipine-olmesartan (AZOR) 5-40 MG tablet Take 1 tablet by mouth daily. 03/05/23   Merrilyn Puma, MD  diclofenac Sodium (VOLTAREN) 1 % GEL Apply 2 g topically 4 (four) times daily. 04/02/23   Rana Snare, DO   esomeprazole (NEXIUM) 40 MG capsule Take 1 capsule (40 mg total) by mouth daily. 03/05/23   Merrilyn Puma, MD  fluticasone (FLONASE) 50 MCG/ACT nasal spray Place 1 spray into both nostrils daily. 01/22/23 01/22/24  Rana Snare, DO  loratadine (CLARITIN) 10 MG tablet Take 1 tablet (10 mg total) by mouth daily. 01/22/23 01/22/24  Rana Snare, DO      Allergies    Patient has no known allergies.    Review of Systems   Review of Systems  All other systems reviewed and are negative.   Physical Exam Updated Vital Signs BP (!) 194/97   Pulse 63   Temp 98 F (36.7 C)   Resp 14   SpO2 100%  Physical Exam Vitals and nursing note reviewed.  Constitutional:      General: He is not in acute distress.    Appearance: He is well-developed.  HENT:     Head: Normocephalic and atraumatic.  Eyes:     Conjunctiva/sclera: Conjunctivae normal.  Cardiovascular:     Rate and Rhythm: Normal rate and regular rhythm.  Pulmonary:     Effort: Pulmonary effort is normal. No respiratory distress.     Breath sounds: Normal breath sounds. No stridor. No wheezing or rales.  Abdominal:     Palpations: Abdomen is soft.     Tenderness: There is no abdominal tenderness.  Musculoskeletal:        General: No swelling.     Cervical back: Neck supple.  Skin:  General: Skin is warm and dry.     Capillary Refill: Capillary refill takes less than 2 seconds.     Comments: Patient with multiple circular erythematous raised wheal-like lesions on his back as well as a couple on his right arm.  Neurological:     Mental Status: He is alert.  Psychiatric:        Mood and Affect: Mood normal.     ED Results / Procedures / Treatments   Labs (all labs ordered are listed, but only abnormal results are displayed) Labs Reviewed - No data to display  EKG None  Radiology No results found.  Procedures Procedures    Medications Ordered in ED Medications  ibuprofen (ADVIL) tablet 600 mg (600 mg Oral Given  05/21/23 1250)  famotidine (PEPCID) tablet 20 mg (20 mg Oral Given 05/21/23 1250)  Tdap (BOOSTRIX) injection 0.5 mL (0.5 mLs Intramuscular Given 05/21/23 1250)    ED Course/ Medical Decision Making/ A&P Clinical Course as of 05/21/23 1530  Fri May 21, 2023  1335 Reevaluation of the patient did show improvement.  Raised wheals seem to be resolving with only mild amount of erythema around initial stings.  Will continue to observe. [CR]    Clinical Course User Index [CR] Peter Garter, PA                                 Medical Decision Making Risk OTC drugs. Prescription drug management.   This patient presents to the ED for concern of insect bite, this involves an extensive number of treatment options, and is a complaint that carries with it a high risk of complications and morbidity.  The differential diagnosis includes anaphylaxis, localized reaction, cellulitis, erysipelas   Co morbidities that complicate the patient evaluation  See HPI   Additional history obtained:  Additional history obtained from EMR External records from outside source obtained and reviewed including hospital records   Lab Tests:  N/a   Imaging Studies ordered:  N/a   Cardiac Monitoring: / EKG:  The patient was maintained on a cardiac monitor.  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  Patient has not been taking his antihypertensive medications in the outpatient setting at all; will recommend continuation of medications..  Otherwise within normal range and stable throughout visit. 82 year old male presents emergency department with complaints of multiple yellowjacket stings on his backside as well as his right arm.  On exam initially, patient with raised wheals like lesions on his back consistent with hives surrounding prior sting locations.  Patient given IM corticosteroid as well as Benadryl in the urgent care prior to telling him to come to the emergency department with no epinephrine administered at that time.  Patient with no prior history of anaphylactic reaction to bee sting.  Patient given additionally Pepcid, abdominal and Tdap update in the ED  and additionally observed for nearly 3 hours which is around 4 hours from initial recurrence.  On final assessment, patient with resolution of hives with only small pinprick lesions left.  No evidence clinically of anaphylaxis.  Recommend symptomatic therapy as described in AVS and follow-up with primary care in the outpatient setting.  Patient will also be fitted with epinephrine pen if development of anaphylaxis occurs.  Treatment plan discussed in length with patient and he acknowledged understanding was agreeable to said plan.  Patient overall well-appearing, afebrile in no acute distress. Worrisome signs and symptoms were discussed with the patient, and the patient acknowledged understanding to return to the ED if noticed. Patient was stable upon discharge.          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

## 2023-05-21 NOTE — ED Notes (Signed)
Reviewed discharge instructions, medications, and home care with pt. Pt verbalized understanding and had no further questions. Pt exited ED without complications.

## 2023-05-21 NOTE — ED Triage Notes (Signed)
Multiple yellow jacket today at 11AM At [facility] gave steroids and benadryl Bp high sent for eval [employer] support person via phone provided translation

## 2023-05-21 NOTE — Discharge Instructions (Addendum)
As discussed, visit to the emergency department today overall reassuring.  Will send you home with EpiPen if concern for anaphylactic reaction occurs; in rare cases this can happen after a long amount of time after initial sting incident.  Will recommend Benadryl cream as well as hydrocortisone cream over areas of your stomach to help with itching and irritation.  You may also take oral Benadryl or allergy medicine such as Zyrtec/Claritin/Allegra for treatment of itching as well as swelling.  Recommend follow-up with primary care for reassessment of your symptoms.  Please do not hesitate to return to emergency department for worrisome signs and symptoms we discussed become apparent.

## 2023-06-07 ENCOUNTER — Telehealth: Payer: Self-pay

## 2023-06-07 NOTE — Telephone Encounter (Signed)
Scheduled transportation for 06/18/2023 9:30 am appointment with Genelle Bal, Congregational nurse [phone]

## 2023-06-18 ENCOUNTER — Encounter: Payer: Self-pay | Admitting: Nurse Practitioner

## 2023-06-18 ENCOUNTER — Other Ambulatory Visit (INDEPENDENT_AMBULATORY_CARE_PROVIDER_SITE_OTHER): Payer: 59

## 2023-06-18 ENCOUNTER — Ambulatory Visit: Payer: 59 | Admitting: Nurse Practitioner

## 2023-06-18 VITALS — BP 130/80 | HR 72 | Ht 62.0 in | Wt 111.0 lb

## 2023-06-18 DIAGNOSIS — R718 Other abnormality of red blood cells: Secondary | ICD-10-CM | POA: Diagnosis not present

## 2023-06-18 DIAGNOSIS — D696 Thrombocytopenia, unspecified: Secondary | ICD-10-CM | POA: Diagnosis not present

## 2023-06-18 DIAGNOSIS — R1010 Upper abdominal pain, unspecified: Secondary | ICD-10-CM | POA: Diagnosis not present

## 2023-06-18 DIAGNOSIS — R933 Abnormal findings on diagnostic imaging of other parts of digestive tract: Secondary | ICD-10-CM

## 2023-06-18 DIAGNOSIS — K219 Gastro-esophageal reflux disease without esophagitis: Secondary | ICD-10-CM

## 2023-06-18 LAB — CBC WITH DIFFERENTIAL/PLATELET
Basophils Absolute: 0 10*3/uL (ref 0.0–0.1)
Basophils Relative: 0.5 % (ref 0.0–3.0)
Eosinophils Absolute: 0.3 10*3/uL (ref 0.0–0.7)
Eosinophils Relative: 3.2 % (ref 0.0–5.0)
HCT: 44.2 % (ref 39.0–52.0)
Hemoglobin: 14.3 g/dL (ref 13.0–17.0)
Lymphocytes Relative: 17 % (ref 12.0–46.0)
Lymphs Abs: 1.4 10*3/uL (ref 0.7–4.0)
MCHC: 32.4 g/dL (ref 30.0–36.0)
MCV: 70.4 fL — ABNORMAL LOW (ref 78.0–100.0)
Monocytes Absolute: 0.5 10*3/uL (ref 0.1–1.0)
Monocytes Relative: 5.9 % (ref 3.0–12.0)
Neutro Abs: 5.9 10*3/uL (ref 1.4–7.7)
Neutrophils Relative %: 73.4 % (ref 43.0–77.0)
Platelets: 166 10*3/uL (ref 150.0–400.0)
RBC: 6.28 Mil/uL — ABNORMAL HIGH (ref 4.22–5.81)
RDW: 16.1 % — ABNORMAL HIGH (ref 11.5–15.5)
WBC: 8.1 10*3/uL (ref 4.0–10.5)

## 2023-06-18 MED ORDER — ESOMEPRAZOLE MAGNESIUM 40 MG PO CPDR: 40.0000 mg | DELAYED_RELEASE_CAPSULE | Freq: Every day | ORAL | 5 refills | Status: AC

## 2023-06-18 NOTE — Progress Notes (Signed)
ASSESSMENT    82 y.o. yo  [place] male , new to the practice, referred by PCP for evaluation of GERD.  He has a history of HTN , diverticulosis, and hepatic steatosis. He does not speak [place]. *Interpreter is present and tells me that patient's language is not available with the online interpreter service.   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  Symptoms refractory to daily Nexium though not taking as directed ( taking after meal)  Chronic intermittent post-prandial upper abdominal burning ( 20 years).  Dyspepsia or possibly gastritis.  Abnormal esophagus and stomach on CT AP with contrast in March 2023  -  distal esophageal wall thickening  / possible gastroduodenitis.    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  MCV in 60's. ? Thalassemia  See PMH for any additional medical history   PLAN   --Difficult social situation.  Patient has to rely on public transportation.  Non-English speaking and per the interpreter today, patient's language is not available with online interpretation services and he cannot read [place] -- Given symptoms and CT scan findings from 2023 patient needs an upper endoscopy.  At the beginning of this visit he declined an upper endoscopy but then changed his mind . However, due to social situation described above he does not have anyone to serve as care partner.  I offered contact information to a company who can serve as care partner but patient declines.  --Anti-reflux measures discussed --Obtain H. Pylori stool ag off PPI x 2 weeks.  [facility] H. pylori test would be easier since Nexium would not need to be held but I have concerns about him being able to complete the test given communication barrier.  --After completion of stool H.pylori test he should resume daily Nexium but change timing to 30 minutes before breakfast ( has been taking it after a  meal )  --CBC, ferritin, TIBC to evaluate for iron deficiency   HPI   Chief complaint :  reflux and upper abdominal burning.   Interpreter present.   Patient gives a history of chronic GERD, maybe started 5 to 8 years ago.  His symptoms include heartburn and regurgitation.  Symptoms are not related to eating.  He did not take any medication for reflux symptoms for many years until starting Nexium at some point ( doesn't know when but at least a year ago).   He takes the Nexium after a meal. He has not noticed any improvement with Nexium.  No dysphagia.  Patient also gives a history of chronic upper abdominal burning, mostly related to eating.  No improvement with Nexium.  He has never tried anything else for the burning including OTC medications.  The burning has been present for at least 20 years, started when he came to the [place]. No NSAID use. No black stools.   No lower GI symptoms.  Describes normal bowel movements.  No blood in stool.  He has never had a colonoscopy  Family history is unknown  Weight stable.    GI History / Studies   none   Labs      Latest Ref Rng & Units 11/09/2021    4:04 PM 02/10/2015   10:00 AM  CBC  WBC 4.0 - 10.5 K/uL 11.1  5.1   Hemoglobin 13.0 - 17.0 g/dL 16.1  09.6   Hematocrit 39.0 - 52.0 % 43.4  39.2   Platelets 150 -  400 K/uL 146  124     Lab Results  Component Value Date   LIPASE 32 11/09/2021      Latest Ref Rng & Units 04/02/2023    9:07 AM 01/22/2023   11:54 AM 11/09/2021    4:04 PM  CMP  Glucose 70 - 99 mg/dL 94  88  284   BUN 8 - 27 mg/dL 10  16  28    Creatinine 0.76 - 1.27 mg/dL 1.32  4.40  1.02   Sodium 134 - 144 mmol/L 141  144  137   Potassium 3.5 - 5.2 mmol/L 4.0  3.9  4.2   Chloride 96 - 106 mmol/L 103  104  103   CO2 20 - 29 mmol/L 24  20  23    Calcium 8.6 - 10.2 mg/dL 9.2  9.5  9.2   Total Protein 6.5 - 8.1 g/dL   7.0   Total Bilirubin 0.3 - 1.2 mg/dL   1.0   Alkaline Phos 38 - 126 U/L   71   AST 15 - 41 U/L    33   ALT 0 - 44 U/L   20         CT ABDOMEN PELVIS W CONTRAST CLINICAL DATA:  Nausea and vomiting.  Acute abdominal pain.  EXAM: CT ABDOMEN AND PELVIS WITH CONTRAST  TECHNIQUE: Multidetector CT imaging of the abdomen and pelvis was performed using the standard protocol following bolus administration of intravenous contrast.  RADIATION DOSE REDUCTION: This exam was performed according to the departmental dose-optimization program which includes automated exposure control, adjustment of the mA and/or kV according to patient size and/or use of iterative reconstruction technique.  CONTRAST:  75mL OMNIPAQUE IOHEXOL 300 MG/ML  SOLN  COMPARISON:  CT 02/10/2015  FINDINGS: Lower chest: Breathing motion artifact limits detailed assessment. Allowing for this, no focal airspace disease or pleural effusion. Minor atelectasis in the medial right middle lobe.  Hepatobiliary: Diffusely decreased hepatic density consistent with steatosis. No focal liver lesion. There is motion artifact through the gallbladder, slight edema adjacent to the gallbladder is felt to be due to adjacent inflamed duodenum rather than primary gallbladder inflammation. There is no abnormal gallbladder distention or calcified gallstone. No common bile duct dilatation for age, 7 mm.  Pancreas: No ductal dilatation or inflammation.  Spleen: Subcapsular calcification. Normal in size without suspicious abnormality.  Adrenals/Urinary Tract: Normal adrenal glands. No hydronephrosis or perinephric edema. Homogeneous renal enhancement with symmetric excretion on delayed phase imaging. No visualized renal stone or focal lesion. Urinary bladder is physiologically distended without wall thickening.  Stomach/Bowel: Motion artifact through the abdomen and pelvis limits assessment. The stomach is nondistended but appears thick walled. There is also likely wall thickening of the distal esophagus. There is enhancement,  wall thickening and adjacent fat stranding about the duodenum as well as distal stomach. Fat stranding with trace free fluid in the right upper quadrant related to the wall in all inflammation. There is a small duodenal diverticulum but no focal diverticular inflammation. The remainder of the small bowel is unremarkable without obstruction or obvious inflammation. Short segment of small bowel extends into the right inguinal canal, series 3, image 63, but no associated wall thickening or obstruction. There is colonic redundancy with the sigmoid colon coursing into the central upper abdomen. Small to moderate colonic stool burden. Minimal sigmoid diverticulosis without diverticulitis. No colonic inflammatory change.  Vascular/Lymphatic: Aortic and branch atherosclerosis. No aortic aneurysm. Patent portal and splenic veins. Superior mesenteric vein is also patent.  No bulky abdominopelvic adenopathy.  Reproductive: Prominent prostate gland spans 4.3 cm.  Other: Fat stranding and inflammatory changes in the right upper quadrant related to distal stomach and duodenal inflammation. Trace reactive free fluid. No perforation or abscess. There is trace free fluid in the dependent pelvis. Postsurgical change of the midline abdominal wall, surgical clips noted in the left mid abdominal wall.  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 Lumbar degenerative change. There is scattered Schmorl's nodes. There are no acute or suspicious osseous abnormalities.  IMPRESSION: 1. Wall thickening, enhancement, and adjacent fat stranding about the distal stomach and duodenum suspicious for gastritis/duodenitis. No perforation or abscess. 2. Additional wall thickening of the more proximal stomach as well as distal esophagus. Query peptic ulcer disease. 3. Hepatic steatosis. 4. Short segment of small bowel extends into the right  inguinal canal, but no associated wall thickening or obstruction. 5. Minimal sigmoid colonic diverticulosis without diverticulitis. 6. 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  Aortic Atherosclerosis (ICD10-I70.0).  Electronically Signed   By: Narda Rutherford M.D.   On: 11/09/2021 18:48 DG Chest 2 View CLINICAL DATA:  Chest pain.  EXAM: CHEST - 2 VIEW  COMPARISON:  Chest x-ray with ribs 04/20/2016.  FINDINGS: The heart size and mediastinal contours are within normal limits. Both lungs are clear. The visualized skeletal structures are unremarkable. Small radiopaque densities are seen in the left anterior chest wall, unchanged. There are surgical changes in the upper abdomen.  IMPRESSION: No active cardiopulmonary disease.  Electronically Signed   By: Darliss Cheney M.D.   On: 11/09/2021 16:55    Past Medical History:  Diagnosis Date   Asthma    Hypertension 01/23/2023   Screening for diabetes mellitus (DM) 01/23/2023   Past Surgical History:  Procedure Laterality Date   ABDOMINAL SURGERY     No family history on file. Social History   Tobacco Use   Smoking status: Never   Smokeless tobacco: Never  Vaping Use   Vaping status: Never Used  Substance Use Topics   Alcohol use: No   Drug use: No   Current Outpatient Medications  Medication Sig Dispense Refill   amLODipine-olmesartan (AZOR) 5-40 MG tablet Take 1 tablet by mouth daily. 30 tablet 3   diclofenac Sodium (VOLTAREN) 1 % GEL Apply 2 g topically 4 (four) times daily. 200 g 11   esomeprazole (NEXIUM) 40 MG capsule Take 1 capsule (40 mg total) by mouth daily. 30 capsule 11   fluticasone (FLONASE) 50 MCG/ACT nasal spray Place 1 spray into both nostrils daily. 11.1 mL 2   loratadine (CLARITIN) 10 MG tablet Take 1 tablet (10 mg total) by mouth daily. 30 tablet 11   diphenhydrAMINE-zinc acetate (BENADRYL EXTRA STRENGTH) cream Apply 1 Application topically 3  (three) times daily as needed for itching. (Patient not taking: Reported on 06/18/2023) 28.4 g 0   EPINEPHrine 0.3 mg/0.3 mL IJ SOAJ injection Inject 0.3 mg into the muscle as needed for anaphylaxis. (Patient not taking: Reported on 06/18/2023) 1 each 0   hydrocortisone cream 1 % Apply to affected area 2 times daily (Patient not taking: Reported on 06/18/2023) 15 g 0   No current facility-administered medications for this visit.   No Known Allergies   Review of Systems: All systems reviewed and negative except where noted in HPI.   Wt Readings from Last 3 Encounters:  06/18/23 111 lb (50.3 kg)  04/02/23 114  lb 14.4 oz (52.1 kg)  03/05/23 117 lb 4.8 oz (53.2 kg)    Physical Exam:  BP 130/80   Pulse 72   Ht 5\' 2"  (1.575 m)   Wt 111 lb (50.3 kg)   BMI 20.30 kg/m  Constitutional:  Pleasant, generally well appearing male in no acute distress. Psychiatric:  Normal mood and affect. Behavior is normal. EENT: Pupils normal.  Conjunctivae are normal. No scleral icterus. Neck supple.  Cardiovascular: Normal rate, regular rhythm.  Pulmonary/chest: Effort normal and breath sounds normal. No wheezing, rales or rhonchi. Abdominal: Soft, nondistended, nontender. Bowel sounds active throughout. There are no masses palpable. No hepatomegaly. Neurological: Alert and oriented to person place and time.   Willette Cluster, NP  06/18/2023, 9:22 AM  Cc:  Referring Provider Tyson Alias

## 2023-06-18 NOTE — Patient Instructions (Addendum)
We have sent the following medications to your pharmacy for you to pick up at your convenience: Esomeprazole- take 30 minutes prior to breakfast.  Your provider has requested that you go to the basement level for lab work before leaving today. Press "B" on the elevator. The lab is located at the first door on the left as you exit the elevator.  Hold your esomeprazole for 2 weeks and do the H. Pylori stool test. The lab is open Mon-Friday 7:30am-5:30pm. To drop off his completed kit.  Due to recent changes in healthcare laws, you may see the results of your imaging and laboratory studies on MyChart before your provider has had a chance to review them.  We understand that in some cases there may be results that are confusing or concerning to you. Not all laboratory results come back in the same time frame and the provider may be waiting for multiple results in order to interpret others.  Please give [place] 48 hours in order for your provider to thoroughly review all the results before contacting the office for clarification of your results.   I appreciate the opportunity to care for you. Willette Cluster, NP-C

## 2023-06-19 LAB — IRON,TIBC AND FERRITIN PANEL
%SAT: 40 % (ref 20–48)
Ferritin: 648 ng/mL — ABNORMAL HIGH (ref 24–380)
Iron: 91 ug/dL (ref 50–180)
TIBC: 229 ug/dL — ABNORMAL LOW (ref 250–425)

## 2023-06-22 NOTE — Progress Notes (Signed)
Agree with assessment/plan.  Raj Florestine Carmical, MD [facility] [phone]  

## 2023-07-02 ENCOUNTER — Other Ambulatory Visit: Payer: 59

## 2023-07-19 ENCOUNTER — Other Ambulatory Visit: Payer: 59

## 2023-07-19 DIAGNOSIS — R718 Other abnormality of red blood cells: Secondary | ICD-10-CM | POA: Diagnosis not present

## 2023-07-20 LAB — HELICOBACTER PYLORI  SPECIAL ANTIGEN
MICRO NUMBER:: 15712826
RESULT:: DETECTED — AB
SPECIMEN QUALITY: ADEQUATE

## 2023-08-11 ENCOUNTER — Encounter: Payer: Self-pay | Admitting: *Deleted

## 2023-08-11 ENCOUNTER — Other Ambulatory Visit: Payer: Self-pay | Admitting: *Deleted

## 2023-08-11 DIAGNOSIS — A048 Other specified bacterial intestinal infections: Secondary | ICD-10-CM

## 2023-08-11 MED ORDER — DOXYCYCLINE HYCLATE 100 MG PO CAPS
100.0000 mg | ORAL_CAPSULE | Freq: Two times a day (BID) | ORAL | 0 refills | Status: AC
Start: 2023-08-11 — End: 2023-08-25

## 2023-08-11 MED ORDER — METRONIDAZOLE 250 MG PO TABS
250.0000 mg | ORAL_TABLET | Freq: Four times a day (QID) | ORAL | 0 refills | Status: AC
Start: 2023-08-11 — End: 2023-08-25

## 2023-08-11 MED ORDER — BISMUTH SUBGALLATE 200 MG PO CAPS
300.0000 mg | ORAL_CAPSULE | Freq: Four times a day (QID) | ORAL | 0 refills | Status: AC
Start: 2023-08-11 — End: 2023-08-25

## 2023-08-11 MED ORDER — OMEPRAZOLE 20 MG PO CPDR
20.0000 mg | DELAYED_RELEASE_CAPSULE | Freq: Two times a day (BID) | ORAL | 0 refills | Status: AC
Start: 2023-08-11 — End: 2023-08-25

## 2023-08-22 ENCOUNTER — Other Ambulatory Visit: Payer: Self-pay | Admitting: Nurse Practitioner

## 2023-08-22 DIAGNOSIS — A048 Other specified bacterial intestinal infections: Secondary | ICD-10-CM

## 2023-09-23 ENCOUNTER — Other Ambulatory Visit (HOSPITAL_COMMUNITY): Payer: Self-pay

## 2023-09-29 ENCOUNTER — Ambulatory Visit: Payer: 59 | Admitting: Nurse Practitioner

## 2023-11-02 ENCOUNTER — Telehealth: Payer: Self-pay

## 2023-11-02 NOTE — Telephone Encounter (Signed)
-----   Message from Willette Cluster sent at 11/02/2023  1:30 PM EST ----- Regarding: FW: unable to contact interpreter for the language noted in the chart, nor freinds noted in the chart Sharol Harness This is something Alona Bene was working on prior to leaving. I don't think this is documented in chart anywhere yet but should be. Can you convert this into some kind of phone note? Thanks ----- Message ----- From: Avanell Shackleton, RN Sent: 08/10/2023   4:34 PM EST To: Meredith Pel, NP Subject: unable to contact interpreter for the langua#  Called the interpreter service and was informed the language noted in the chart is not a familiar language and is unable to contact this patient because the language is unknown. The interpreter also states the patient's name is not listed as a Dailri name and no language again is familiar with the Rade or Radi as noted in the patient's chart. Also called the noted friends in chart 08/09/23, the first number states it is the wrong number and no answer noted with the second noted contact. Called both numbers again today and was no answer noted at either phone number.

## 2023-12-06 DIAGNOSIS — H2513 Age-related nuclear cataract, bilateral: Secondary | ICD-10-CM | POA: Diagnosis not present

## 2023-12-31 DIAGNOSIS — H2512 Age-related nuclear cataract, left eye: Secondary | ICD-10-CM | POA: Diagnosis not present

## 2023-12-31 DIAGNOSIS — I10 Essential (primary) hypertension: Secondary | ICD-10-CM | POA: Diagnosis not present

## 2024-01-12 DIAGNOSIS — I10 Essential (primary) hypertension: Secondary | ICD-10-CM | POA: Diagnosis not present

## 2024-01-12 DIAGNOSIS — H2511 Age-related nuclear cataract, right eye: Secondary | ICD-10-CM | POA: Diagnosis not present

## 2024-05-19 ENCOUNTER — Other Ambulatory Visit: Payer: Self-pay | Admitting: *Deleted

## 2024-05-19 ENCOUNTER — Ambulatory Visit: Payer: Self-pay

## 2024-05-19 NOTE — Telephone Encounter (Signed)
 CHW states pt speaks [place], this RN corrected language in record. Patient will require an interpreter.  Advised patient to go to the ED if symptoms return before appointment on Monday.  FYI Only or Action Required?: FYI only for provider.  Patient was last seen in primary care on 04/02/2023 by Elicia Sharper, DO.  Called Nurse Triage reporting No chief complaint on file..  Symptoms began a week ago.  Interventions attempted: Rest, hydration, or home remedies.  Symptoms are: completely resolved.  Triage Disposition: See PCP When Office is Open (Within 3 Days)  Patient/caregiver understands and will follow disposition?: Yes  Reason for Disposition  [1] New-onset headache AND [2] age > 50 years  Answer Assessment - Initial Assessment Questions CHW reports patient told her he has been having headaches and dizziness. Patient on line as well, reports that symptoms are intermittent. Denies current symptoms, denies hx of same.   2. ONSET: When did the headache start? (e.g., minutes, hours, days)      One week  3. PATTERN: Does the pain come and go, or has it been constant since it started?     Intermittent  4. SEVERITY: How bad is the pain? and What does it keep you from doing?  (e.g., Scale 1-10; mild, moderate, or severe)     Severe  5. RECURRENT SYMPTOM: Have you ever had headaches before?      Yes  6. CAUSE: What do you think is causing the headache?     Unknown  7. MIGRAINE: Have you been diagnosed with migraine headaches? If Yes, ask: Is this headache similar?      Unsure  8. HEAD INJURY: Has there been any recent injury to your head?      Denies  Protocols used: [facility] Copied from CRM [id number]. Topic: Clinical - Red Word Triage >> May 19, 2024 11:22 AM Laurier BROCKS wrote: Red Word that prompted transfer to Nurse Triage: Patient advised his community worker yesterday he has been having headaches, light headedness, dizziness, and blurry vision.

## 2024-05-19 NOTE — Telephone Encounter (Signed)
 I called [place], CHW - no answer.

## 2024-05-22 ENCOUNTER — Encounter: Payer: Self-pay | Admitting: Student

## 2024-05-22 ENCOUNTER — Ambulatory Visit: Payer: Self-pay | Admitting: Student

## 2024-05-22 ENCOUNTER — Other Ambulatory Visit: Payer: Self-pay

## 2024-05-22 ENCOUNTER — Ambulatory Visit (HOSPITAL_COMMUNITY)
Admission: RE | Admit: 2024-05-22 | Discharge: 2024-05-22 | Disposition: A | Discharge status: Home / Self Care | Source: Ambulatory Visit | Attending: Family Medicine | Admitting: Family Medicine

## 2024-05-22 VITALS — BP 152/81 | HR 68 | Temp 97.7°F | Ht 60.0 in | Wt 113.2 lb

## 2024-05-22 DIAGNOSIS — Z79899 Other long term (current) drug therapy: Secondary | ICD-10-CM | POA: Diagnosis not present

## 2024-05-22 DIAGNOSIS — I2089 Other forms of angina pectoris: Secondary | ICD-10-CM | POA: Diagnosis not present

## 2024-05-22 DIAGNOSIS — I10 Essential (primary) hypertension: Secondary | ICD-10-CM | POA: Insufficient documentation

## 2024-05-22 DIAGNOSIS — R7303 Prediabetes: Secondary | ICD-10-CM | POA: Diagnosis not present

## 2024-05-22 LAB — POCT GLYCOSYLATED HEMOGLOBIN (HGB A1C): HbA1c, POC (prediabetic range): 5.8 % (ref 5.7–6.4)

## 2024-05-22 LAB — GLUCOSE, CAPILLARY: Glucose-Capillary: 118 mg/dL — ABNORMAL HIGH (ref 70–99)

## 2024-05-22 MED ORDER — AMLODIPINE-OLMESARTAN 5-40 MG PO TABS
1.0000 | ORAL_TABLET | Freq: Every day | ORAL | 3 refills | Status: DC
Start: 2024-05-22 — End: 2024-06-21

## 2024-05-22 NOTE — Patient Instructions (Signed)
 Thank you, Mr.Sylar Kory for allowing us  to provide your care today. Today we discussed:  - I sent a referral to cardiology for your chest pain  - START amlodipine -olmesartan  5-40 mg, one tablet daily   I have ordered the following labs for you:   Lab Orders         Basic metabolic panel with GFR         POC Hbg A1C      Tests ordered today:  As above    Referrals ordered today:    Referral Orders         Ambulatory referral to Cardiology      I have ordered the following medication/changed the following medications:   Stop the following medications: Medications Discontinued During This Encounter  Medication Reason   diphenhydrAMINE -zinc  acetate (BENADRYL  EXTRA STRENGTH) cream    EPINEPHrine  0.3 mg/0.3 mL IJ SOAJ injection    hydrocortisone  cream 1 %    amLODipine -olmesartan  (AZOR ) 5-40 MG tablet Reorder     Start the following medications: Meds ordered this encounter  Medications   amLODipine -olmesartan  (AZOR ) 5-40 MG tablet    Sig: Take 1 tablet by mouth daily.    Dispense:  30 tablet    Refill:  3     Follow up: 1 month HTN and angina      Remember:   Should you have any questions or concerns please call the internal medicine clinic at [phone].     Rayann Atway, D.O. [facility]

## 2024-05-22 NOTE — Progress Notes (Unsigned)
   Established Patient Office Visit  Subjective   Patient ID: Atom Solivan, male    DOB: 12/30/1940  Age: 83 y.o. MRN: [id number]  Chief Complaint  Patient presents with   Acute Visit    Dizziness and headache pt reports having dizzy spells   for over a year .... So visit is a  f/u to vertigo     Mr.Zacari Stiff is a 83 y.o. male with past medical history of hypertension, chronic back pain presents today for headache and dizziness.  Last office visit 04/02/2023  Review of Systems:  As per assessment and Plan   Objective:     Vitals:   05/22/24 0947  BP: (!) 152/81  Pulse: 68  Temp: 97.7 F (36.5 C)  TempSrc: Oral  SpO2: 98%  Weight: 13 lb 3.2 oz (5.987 kg)  Height: 5' (1.524 m)   ***  Physical Exam General: Sitting in chair, no acute distress Cardiovascular: Regular rate Pulmonary: Breathing comfortably Abdomen: Soft, nontender, nondistended MSK: No lower extremity edema bilaterally  Last CBC Lab Results  Component Value Date   WBC 8.1 06/18/2023   HGB 14.3 06/18/2023   HCT 44.2 06/18/2023   MCV 70.4 (L) 06/18/2023   MCH 21.7 (L) 11/09/2021   RDW 16.1 (H) 06/18/2023   PLT 166.0 06/18/2023   Last metabolic panel Lab Results  Component Value Date   GLUCOSE 94 04/02/2023   NA 141 04/02/2023   K 4.0 04/02/2023   CL 103 04/02/2023   CO2 24 04/02/2023   BUN 10 04/02/2023   CREATININE 0.77 04/02/2023   EGFR 90 04/02/2023   CALCIUM 9.2 04/02/2023   PROT 7.0 11/09/2021   ALBUMIN 3.8 11/09/2021   BILITOT 1.0 11/09/2021   ALKPHOS 71 11/09/2021   AST 33 11/09/2021   ALT 20 11/09/2021   ANIONGAP 11 11/09/2021      The ASCVD Risk score (Arnett DK, et al., 2019) failed to calculate for the following reasons:   The 2019 ASCVD risk score is only valid for ages 67 to 13    Assessment & Plan:   Patient {GC/GE:[id number]::discussed with,seen with} Dr. {NAMES:[id number]::Chambliss,Chun,Hoffman,Lau,Machen,Narendra,Williams,Winfrey}  Hypertension BP  Readings from Last 3 Encounters:  05/22/24 (!) 152/81  06/18/23 130/80  05/21/23 (!) 194/97   Medication consist of amlodipine -olmesartan  5-40 mg daily.   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 Denies any CP currently. No LE swelling.  - EKG   Plan: - amlodipine -olmesartan  5-40 mg daily.  - repeat BMP 1 month - VBCI to Pharm for medication   Prediabetes Lab Results  Component Value Date   HGBA1C 5.9 (H) 01/22/2023    Pneumococcal vaccine  Flu vaccine  Problem List Items Addressed This Visit   None   No follow-ups on file.    Toma Edwards, DO

## 2024-05-23 LAB — BASIC METABOLIC PANEL WITH GFR
BUN/Creatinine Ratio: 16 (ref 10–24)
BUN: 12 mg/dL (ref 8–27)
CO2: 23 mmol/L (ref 20–29)
Calcium: 9.7 mg/dL (ref 8.6–10.2)
Chloride: 103 mmol/L (ref 96–106)
Creatinine, Ser: 0.77 mg/dL (ref 0.76–1.27)
Glucose: 104 mg/dL — ABNORMAL HIGH (ref 70–99)
Potassium: 4.2 mmol/L (ref 3.5–5.2)
Sodium: 144 mmol/L (ref 134–144)
eGFR: 89 mL/min/1.73 (ref >59)

## 2024-05-24 NOTE — Assessment & Plan Note (Signed)
 This is an 83 year old male presenting today with concerns of headache and dizziness.  Patient reports feeling lightheaded when working outside with headaches, chest tightness.  Patient describes his chest pain as pressure-like when he works outside, reports it is also present at rest.  Denies any radiation to bilateral arms or neck.  Denies any room spinning sensation.  Reports occasional tinnitus.  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  In office, his blood pressure is elevated to 180/92 while sitting, at standing BP 211/85.  EKG in office without ST elevation.  Patient is currently not taking any medications.  This patient will benefit with cardiac stress test to evaluate for any ischemia with exertion. -Referral to cardiology is placed for cardiac stress test

## 2024-05-24 NOTE — Assessment & Plan Note (Signed)
 Will check A1c

## 2024-05-24 NOTE — Assessment & Plan Note (Signed)
 BP Readings from Last 3 Encounters:  05/22/24 (!) 152/81  06/18/23 130/80  05/21/23 (!) 194/97   Blood pressure is elevated in office, patient is currently not taking any medications.  Per chart review patient was supposed to be on amlodipine -olmesartan  5 - 40 mg daily.  However he is currently not take any medications.  Patient does report symptoms of feeling dizzy and lightheaded, with occasional tinnitus.  Does report symptoms of chest pain with exertion.  Patient was advised to restart his medications, and return to clinic in 1 month. -Restart amlodipine -olmesartan  5-40 mg daily -RTC 1 month for BMP check

## 2024-05-29 NOTE — Progress Notes (Signed)
 Internal Medicine Clinic Attending  Case discussed with the resident at the time of the visit.  We reviewed the resident's history and exam and pertinent patient test results.  I agree with the assessment, diagnosis, and plan of care documented in the resident's note.

## 2024-06-21 ENCOUNTER — Encounter: Payer: Self-pay | Admitting: Student

## 2024-06-21 ENCOUNTER — Ambulatory Visit (INDEPENDENT_AMBULATORY_CARE_PROVIDER_SITE_OTHER): Admitting: Student

## 2024-06-21 VITALS — BP 143/78 | HR 75 | Temp 98.2°F | Ht 60.0 in | Wt 111.8 lb

## 2024-06-21 DIAGNOSIS — I10 Essential (primary) hypertension: Secondary | ICD-10-CM | POA: Diagnosis not present

## 2024-06-21 DIAGNOSIS — Z8619 Personal history of other infectious and parasitic diseases: Secondary | ICD-10-CM

## 2024-06-21 DIAGNOSIS — Z23 Encounter for immunization: Secondary | ICD-10-CM

## 2024-06-21 DIAGNOSIS — G8929 Other chronic pain: Secondary | ICD-10-CM | POA: Diagnosis not present

## 2024-06-21 DIAGNOSIS — Z79899 Other long term (current) drug therapy: Secondary | ICD-10-CM

## 2024-06-21 DIAGNOSIS — A048 Other specified bacterial intestinal infections: Secondary | ICD-10-CM

## 2024-06-21 DIAGNOSIS — M542 Cervicalgia: Secondary | ICD-10-CM

## 2024-06-21 MED ORDER — AMLODIPINE-OLMESARTAN 5-40 MG PO TABS: 1.0000 | ORAL_TABLET | Freq: Every day | ORAL | 3 refills | Status: AC

## 2024-06-21 MED ORDER — DICLOFENAC SODIUM 1 % EX GEL: 2.0000 g | Freq: Four times a day (QID) | CUTANEOUS | 7 refills | Status: AC

## 2024-06-21 NOTE — Assessment & Plan Note (Addendum)
 Status: Not at goal. BP today 143/70 on repeat.  Recently restarted amlodipine -olmesartan  5-40 mg daily.  He had ran out of the medication previously.  Last BMP in 05/2024 was WNL.  Did not take medication today due to running out yesterday.  Denies any acute symptoms/concerns. Denies chest pain or headache today. States when he restarted antihypertensive his chest pain and other discomforts improved.  Plan -Refilled amlodipine -olmesartan  with 90-day supply w/ refills -BMP today since back on ARB

## 2024-06-21 NOTE — Patient Instructions (Addendum)
 Thank you, Maxwell Jackson for allowing us  to provide your care today. Today we discussed:  -Breath test to check if stomach bacteria infection is gone after treatment.   -After testing, you can restart your Nexium   -Continue taking your blood pressure medicine (amlodipine -olmesartan ) -Continue taking the Voltaren  gel over back of neck for pain up to 4 times a day  Follow up: 3-4 months   Should you have any questions or concerns please call the internal medicine clinic at [phone].    Marvel Sapp, D.O. [facility]

## 2024-06-21 NOTE — Progress Notes (Signed)
 CC: HTN f/u  HPI: [name] is a 83 y.o. male living with a history stated below and presents today for HTN f/u. Please see problem based assessment and plan for additional details.  Past Medical History:  Diagnosis Date   Asthma    Hypertension 01/23/2023   Screening for diabetes mellitus (DM) 01/23/2023    Current Outpatient Medications on File Prior to Visit  Medication Sig Dispense Refill   esomeprazole  (NEXIUM ) 40 MG capsule Take 1 capsule (40 mg total) by mouth daily before breakfast. 30 capsule 5   fluticasone  (FLONASE ) 50 MCG/ACT nasal spray Place 1 spray into both nostrils daily. 11.1 mL 2   loratadine  (CLARITIN ) 10 MG tablet Take 1 tablet (10 mg total) by mouth daily. 30 tablet 11   omeprazole  (PRILOSEC) 20 MG capsule Take 1 capsule (20 mg total) by mouth 2 (two) times daily before a meal for 14 days. 28 capsule 0   No current facility-administered medications on file prior to visit.    No family history on file.  Social History   Socioeconomic History   Marital status: Married    Spouse name: Not on file   Number of children: 3   Years of education: Not on file   Highest education level: Not on file  Occupational History   Occupation: landscaping  Tobacco Use   Smoking status: Never   Smokeless tobacco: Never  Vaping Use   Vaping status: Never Used  Substance and Sexual Activity   Alcohol use: No   Drug use: No   Sexual activity: Never  Other Topics Concern   Not on file  Social History Narrative   Not on file   Social Drivers of Health   Financial Resource Strain: High Risk (01/25/2023)   Overall Financial Resource Strain (CARDIA)    Difficulty of Paying Living Expenses: Hard  Food Insecurity: No Food Insecurity (01/25/2023)   Hunger Vital Sign    Worried About Running Out of Food in the Last Year: Never true    Ran Out of Food in the Last Year: Never true  Transportation Needs: Unmet Transportation Needs (01/25/2023)   PRAPARE -  Transportation    Lack of Transportation (Medical): Yes    Lack of Transportation (Non-Medical): Yes  Physical Activity: Sufficiently Active (01/25/2023)   Exercise Vital Sign    Days of Exercise per Week: 3 days    Minutes of Exercise per Session: 120 min  Stress: Stress Concern Present (01/25/2023)   [employer] of Occupational Health - Occupational Stress Questionnaire    Feeling of Stress : To some extent  Social Connections: Socially Integrated (01/25/2023)   Social Connection and Isolation Panel    Frequency of Communication with Friends and Family: More than three times a week    Frequency of Social Gatherings with Friends and Family: Once a week    Attends Religious Services: More than 4 times per year    Active Member of [employer] or Organizations: Yes    Attends Engineer, structural: More than 4 times per year    Marital Status: Married  Catering manager Violence: Not At Risk (01/25/2023)   Humiliation, Afraid, Rape, and Kick questionnaire    Fear of Current or Ex-Partner: No    Emotionally Abused: No    Physically Abused: No    Sexually Abused: No    Review of Systems: ROS negative except for what is noted on the assessment and plan.  Vitals:   06/21/24 0920 06/21/24 9074  BP: (!) 158/79 (!) 143/78  Pulse: 72 75  Temp: 98.2 F (36.8 C)   TempSrc: Oral   SpO2: 97%   Weight: 111 lb 12.8 oz (50.7 kg)   Height: 5' (1.524 m)    Physical Exam: Constitutional: Alert, sitting in chair, in no acute distress Cardiovascular: regular rate and rhythm Pulmonary/Chest: normal work of breathing on room air Neurological: alert & oriented x 3 Skin: warm and dry  Assessment & Plan:   Assessment & Plan Primary hypertension Status: Not at goal. BP today 143/70 on repeat.  Recently restarted amlodipine -olmesartan  5-40 mg daily.  He had ran out of the medication previously.  Last BMP in 05/2024 was WNL.  Did not take medication today due to running out yesterday.  Denies  any acute symptoms/concerns. Denies chest pain or headache today. States when he restarted antihypertensive his chest pain and other discomforts improved.  Plan -Refilled amlodipine -olmesartan  with 90-day supply w/ refills -BMP today since back on ARB Chronic neck pain Refill Voltaren  gel 4 times daily as needed.  No acute changes today. H. pylori infection Noted treatment for H. pylori by [facility] in Nov/Dec 2024.  Patient states completed quadruple therapy.  However did not return for stool retesting for medication.  States been off of PPI or antibiotics, will proceed with H. pylori breath testing today. Encounter for immunization Received flu shot today.  Orders Placed This Encounter  Procedures   Flu vaccine HIGH DOSE PF(Fluzone Trivalent)   H. pylori breath test   Basic metabolic panel with GFR   Return in about 3 months (around 09/21/2024) for routine, blood pressure.   Patient discussed with Dr. CHARLENA Rosan Ozell Elicia, D.O. [facility], PGY-3 Phone: [phone] Date 06/21/2024 Time 1:49 PM

## 2024-06-21 NOTE — Assessment & Plan Note (Signed)
 Refill Voltaren  gel 4 times daily as needed.  No acute changes today.

## 2024-06-22 LAB — BASIC METABOLIC PANEL WITH GFR
BUN/Creatinine Ratio: 21 (ref 10–24)
BUN: 20 mg/dL (ref 8–27)
CO2: 18 mmol/L — ABNORMAL LOW (ref 20–29)
Calcium: 9.9 mg/dL (ref 8.6–10.2)
Chloride: 102 mmol/L (ref 96–106)
Creatinine, Ser: 0.96 mg/dL (ref 0.76–1.27)
Glucose: 99 mg/dL (ref 70–99)
Potassium: 4.7 mmol/L (ref 3.5–5.2)
Sodium: 142 mmol/L (ref 134–144)
eGFR: 79 mL/min/1.73 (ref >59)

## 2024-06-22 LAB — H. PYLORI BREATH TEST: H pylori Breath Test: POSITIVE — AB

## 2024-06-22 NOTE — Progress Notes (Signed)
 Internal Medicine Clinic Attending  Case discussed with the resident at the time of the visit.  We reviewed the resident's history and exam and pertinent patient test results.  I agree with the assessment, diagnosis, and plan of care documented in the resident's note.

## 2024-06-23 ENCOUNTER — Ambulatory Visit: Payer: Self-pay | Admitting: Student

## 2024-06-23 MED ORDER — OMEPRAZOLE 20 MG PO CPDR: 20.0000 mg | DELAYED_RELEASE_CAPSULE | Freq: Two times a day (BID) | ORAL | 0 refills | Status: AC

## 2024-06-23 MED ORDER — METRONIDAZOLE 500 MG PO TABS: 500.0000 mg | ORAL_TABLET | Freq: Three times a day (TID) | ORAL | 0 refills | Status: AC

## 2024-06-23 MED ORDER — BISMUTH SUBSALICYLATE 525 MG PO TABS: 525.0000 mg | ORAL_TABLET | Freq: Four times a day (QID) | ORAL | 0 refills | Status: AC

## 2024-06-23 MED ORDER — TETRACYCLINE HCL 500 MG PO CAPS: 500.0000 mg | ORAL_CAPSULE | Freq: Four times a day (QID) | ORAL | 0 refills | Status: AC

## 2024-06-23 NOTE — Addendum Note (Signed)
 Addended by: ELICIA SHARPER on: 06/23/2024 05:37 PM   Modules accepted: Orders

## 2024-06-23 NOTE — Addendum Note (Signed)
 Addended by: ELICIA SHARPER on: 06/23/2024 09:39 AM   Modules accepted: Orders

## 2024-06-28 NOTE — Congregational Nurse Program (Signed)
 Home visit with interpreter Diu Hartshorn in response to referral from PCP [facility] center who has not been able to reach patient by phone to inform him of test results and new prescriptions for H.Pylori infection.  Patient's house mate stated patient is at work doing part time yard work and will not be home until after 5:00. We attempted to call patient without success.  Reported these findings to [facility] at [facility] who stated she will continue to try reaching patient.  Elveria Rummer RN, Congregational Nurse [phone]
